# Patient Record
Sex: Female | Born: 1965 | Race: Black or African American | Hispanic: No | Marital: Married | Smoking: Never smoker
Health system: Southern US, Community
[De-identification: ages and names within clinical notes are randomized; demographics above are authoritative.]

## PROBLEM LIST (undated history)

## (undated) DIAGNOSIS — N2889 Other specified disorders of kidney and ureter: Secondary | ICD-10-CM

## (undated) DIAGNOSIS — I10 Essential (primary) hypertension: Secondary | ICD-10-CM

## (undated) DIAGNOSIS — J302 Other seasonal allergic rhinitis: Secondary | ICD-10-CM

## (undated) HISTORY — PX: GASTRIC BYPASS: SHX52

## (undated) HISTORY — PX: ABDOMINAL HYSTERECTOMY: SHX81

## (undated) HISTORY — PX: FOOT ARTHRODESIS, MODIFIED MCBRIDE: SUR52

## (undated) HISTORY — PX: CORNEAL TRANSPLANT: SHX108

## (undated) HISTORY — PX: TONSILLECTOMY: SUR1361

---

## 2004-05-25 ENCOUNTER — Emergency Department: Payer: Self-pay | Admitting: Emergency Medicine

## 2004-05-30 ENCOUNTER — Emergency Department: Payer: Self-pay | Admitting: Emergency Medicine

## 2004-05-30 ENCOUNTER — Ambulatory Visit: Payer: Self-pay | Admitting: Emergency Medicine

## 2007-08-18 ENCOUNTER — Ambulatory Visit: Payer: Self-pay | Admitting: Family Medicine

## 2007-09-07 ENCOUNTER — Ambulatory Visit: Payer: Self-pay | Admitting: Family Medicine

## 2008-07-17 ENCOUNTER — Ambulatory Visit: Payer: Self-pay | Admitting: Family Medicine

## 2008-11-03 ENCOUNTER — Ambulatory Visit: Payer: Self-pay | Admitting: Internal Medicine

## 2008-11-09 ENCOUNTER — Ambulatory Visit: Payer: Self-pay | Admitting: Oncology

## 2008-12-04 ENCOUNTER — Ambulatory Visit: Payer: Self-pay | Admitting: Internal Medicine

## 2008-12-04 ENCOUNTER — Ambulatory Visit: Payer: Self-pay | Admitting: Oncology

## 2009-01-03 ENCOUNTER — Ambulatory Visit: Payer: Self-pay | Admitting: Oncology

## 2009-01-03 ENCOUNTER — Ambulatory Visit: Payer: Self-pay | Admitting: Internal Medicine

## 2009-02-03 ENCOUNTER — Ambulatory Visit: Payer: Self-pay | Admitting: Oncology

## 2009-02-03 ENCOUNTER — Ambulatory Visit: Payer: Self-pay | Admitting: Internal Medicine

## 2009-02-21 ENCOUNTER — Ambulatory Visit: Payer: Self-pay | Admitting: Obstetrics & Gynecology

## 2009-03-01 ENCOUNTER — Ambulatory Visit: Payer: Self-pay | Admitting: Obstetrics & Gynecology

## 2009-03-06 ENCOUNTER — Ambulatory Visit: Payer: Self-pay | Admitting: Internal Medicine

## 2009-03-23 ENCOUNTER — Ambulatory Visit: Payer: Self-pay | Admitting: Internal Medicine

## 2009-04-03 ENCOUNTER — Ambulatory Visit: Payer: Self-pay | Admitting: Internal Medicine

## 2009-05-04 ENCOUNTER — Ambulatory Visit: Payer: Self-pay | Admitting: Internal Medicine

## 2009-06-03 ENCOUNTER — Ambulatory Visit: Payer: Self-pay | Admitting: Internal Medicine

## 2009-07-04 ENCOUNTER — Ambulatory Visit: Payer: Self-pay | Admitting: Internal Medicine

## 2012-02-24 DIAGNOSIS — E669 Obesity, unspecified: Secondary | ICD-10-CM | POA: Insufficient documentation

## 2012-05-13 ENCOUNTER — Ambulatory Visit: Payer: Self-pay | Admitting: Family Medicine

## 2012-05-20 DIAGNOSIS — N2889 Other specified disorders of kidney and ureter: Secondary | ICD-10-CM | POA: Insufficient documentation

## 2014-10-25 DIAGNOSIS — R809 Proteinuria, unspecified: Secondary | ICD-10-CM | POA: Insufficient documentation

## 2014-10-28 DIAGNOSIS — E538 Deficiency of other specified B group vitamins: Secondary | ICD-10-CM | POA: Insufficient documentation

## 2014-10-28 DIAGNOSIS — N183 Chronic kidney disease, stage 3 unspecified: Secondary | ICD-10-CM | POA: Insufficient documentation

## 2014-10-28 DIAGNOSIS — E559 Vitamin D deficiency, unspecified: Secondary | ICD-10-CM | POA: Insufficient documentation

## 2015-09-24 ENCOUNTER — Encounter: Payer: Self-pay | Admitting: Podiatry

## 2015-09-24 ENCOUNTER — Ambulatory Visit: Payer: Self-pay

## 2015-10-19 DIAGNOSIS — J301 Allergic rhinitis due to pollen: Secondary | ICD-10-CM | POA: Insufficient documentation

## 2015-12-04 DIAGNOSIS — Z9884 Bariatric surgery status: Secondary | ICD-10-CM | POA: Insufficient documentation

## 2015-12-15 NOTE — Progress Notes (Signed)
This encounter was created in error - please disregard.

## 2016-02-21 ENCOUNTER — Inpatient Hospital Stay: Admission: RE | Admit: 2016-02-21 | Payer: Self-pay | Source: Ambulatory Visit

## 2016-02-26 ENCOUNTER — Encounter
Admission: RE | Admit: 2016-02-26 | Discharge: 2016-02-26 | Disposition: A | Discharge status: Home / Self Care | Payer: Managed Care, Other (non HMO) | Source: Ambulatory Visit | Attending: Orthopedic Surgery | Admitting: Orthopedic Surgery

## 2016-02-26 ENCOUNTER — Encounter: Payer: Self-pay | Admitting: *Deleted

## 2016-02-26 DIAGNOSIS — R9431 Abnormal electrocardiogram [ECG] [EKG]: Secondary | ICD-10-CM | POA: Insufficient documentation

## 2016-02-26 DIAGNOSIS — Z90710 Acquired absence of both cervix and uterus: Secondary | ICD-10-CM | POA: Diagnosis not present

## 2016-02-26 DIAGNOSIS — I129 Hypertensive chronic kidney disease with stage 1 through stage 4 chronic kidney disease, or unspecified chronic kidney disease: Secondary | ICD-10-CM | POA: Diagnosis not present

## 2016-02-26 DIAGNOSIS — G4730 Sleep apnea, unspecified: Secondary | ICD-10-CM | POA: Diagnosis not present

## 2016-02-26 DIAGNOSIS — Z888 Allergy status to other drugs, medicaments and biological substances status: Secondary | ICD-10-CM | POA: Diagnosis not present

## 2016-02-26 DIAGNOSIS — Z833 Family history of diabetes mellitus: Secondary | ICD-10-CM | POA: Diagnosis not present

## 2016-02-26 DIAGNOSIS — Z01810 Encounter for preprocedural cardiovascular examination: Secondary | ICD-10-CM

## 2016-02-26 DIAGNOSIS — D649 Anemia, unspecified: Secondary | ICD-10-CM | POA: Diagnosis not present

## 2016-02-26 DIAGNOSIS — I517 Cardiomegaly: Secondary | ICD-10-CM | POA: Insufficient documentation

## 2016-02-26 DIAGNOSIS — Z91040 Latex allergy status: Secondary | ICD-10-CM | POA: Diagnosis not present

## 2016-02-26 DIAGNOSIS — E538 Deficiency of other specified B group vitamins: Secondary | ICD-10-CM | POA: Diagnosis not present

## 2016-02-26 DIAGNOSIS — Z8249 Family history of ischemic heart disease and other diseases of the circulatory system: Secondary | ICD-10-CM | POA: Diagnosis not present

## 2016-02-26 DIAGNOSIS — Z8489 Family history of other specified conditions: Secondary | ICD-10-CM | POA: Diagnosis not present

## 2016-02-26 DIAGNOSIS — I10 Essential (primary) hypertension: Secondary | ICD-10-CM

## 2016-02-26 DIAGNOSIS — M654 Radial styloid tenosynovitis [de Quervain]: Secondary | ICD-10-CM | POA: Diagnosis present

## 2016-02-26 DIAGNOSIS — Z8042 Family history of malignant neoplasm of prostate: Secondary | ICD-10-CM | POA: Diagnosis not present

## 2016-02-26 DIAGNOSIS — Z79899 Other long term (current) drug therapy: Secondary | ICD-10-CM | POA: Diagnosis not present

## 2016-02-26 DIAGNOSIS — Z6841 Body Mass Index (BMI) 40.0 and over, adult: Secondary | ICD-10-CM | POA: Diagnosis not present

## 2016-02-26 DIAGNOSIS — Z91013 Allergy to seafood: Secondary | ICD-10-CM | POA: Diagnosis not present

## 2016-02-26 DIAGNOSIS — Z7982 Long term (current) use of aspirin: Secondary | ICD-10-CM | POA: Diagnosis not present

## 2016-02-26 DIAGNOSIS — Z803 Family history of malignant neoplasm of breast: Secondary | ICD-10-CM | POA: Diagnosis not present

## 2016-02-26 DIAGNOSIS — G4700 Insomnia, unspecified: Secondary | ICD-10-CM | POA: Diagnosis not present

## 2016-02-26 DIAGNOSIS — E559 Vitamin D deficiency, unspecified: Secondary | ICD-10-CM | POA: Diagnosis not present

## 2016-02-26 DIAGNOSIS — N183 Chronic kidney disease, stage 3 (moderate): Secondary | ICD-10-CM | POA: Diagnosis not present

## 2016-02-26 HISTORY — DX: Other seasonal allergic rhinitis: J30.2

## 2016-02-26 HISTORY — DX: Other specified disorders of kidney and ureter: N28.89

## 2016-02-26 HISTORY — DX: Essential (primary) hypertension: I10

## 2016-02-26 NOTE — Patient Instructions (Addendum)
  Your procedure is scheduled on: 02/28/16 Thurs Report to Same Day Surgery 2nd floor medical mall [facility](Medical Mall Entrance-take elevator on left to 2nd floor.  Check in with surgery information desk.) To find out your arrival time please call [phone] between 1PM - 3PM on 02/27/16 Wed Remember: Instructions that are not followed completely may result in serious medical risk, up to and including death, or upon the discretion of your surgeon and anesthesiologist your surgery may need to be rescheduled.    _x___ 1. Do not eat food or drink liquids after midnight. No gum chewing or hard candies.     __x__ 2. No Alcohol for 24 hours before or after surgery.   __x__3. No Smoking for 24 prior to surgery.   ____  4. Bring all medications with you on the day of surgery if instructed.    __x__ 5. Notify your doctor if there is any change in your medical condition     (cold, fever, infections).     Do not wear jewelry, make-up, hairpins, clips or nail polish.  Do not wear lotions, powders, or perfumes. You may wear deodorant.  Do not shave 48 hours prior to surgery. Men may shave face and neck.  Do not bring valuables to the hospital.    [facility] not [facility] for any belongings or valuables.               Contacts, dentures or bridgework may not be worn into surgery.  Leave your suitcase in the car. After surgery it may be brought to your room.  For patients admitted to the hospital, discharge time is determined by your treatment team.   Patients discharged the day of surgery will not be allowed to drive home.  You will need someone to drive you home and stay with you the night of your procedure.    Please read over the following fact sheets that you were given:   [facility] Pre[facility] and or MRSA Information   _x___ Take these medicines the morning of surgery with A SIP OF WATER:    1. lisinopril (PRINIVIL  2.  3.  4.  5.  6.  ____Fleets enema or Magnesium  Citrate as directed.   _x___ Use CHG Soap or sage wipes as directed on instruction sheet   ____ Use inhalers on the day of surgery and bring to hospital day of surgery  ____ Stop metformin 2 days prior to surgery    ____ Take 1/2 of usual insulin dose the night before surgery and none on the morning of           surgery.   ____ Stop Aspirin, Coumadin, Pllavix ,Eliquis, Effient, or Pradaxa  x__ Stop Anti-inflammatories such as Advil, Aleve, Ibuprofen, Motrin, Naproxen,          Naprosyn, Goodies powders or aspirin products. Ok to take Tylenol.   ____ Stop supplements until after surgery.    ____ Bring C-Pap to the hospital.

## 2016-02-28 ENCOUNTER — Ambulatory Visit
Admission: RE | Admit: 2016-02-28 | Discharge: 2016-02-28 | Disposition: A | Discharge status: Home / Self Care | Payer: Managed Care, Other (non HMO) | Source: Ambulatory Visit | Attending: Orthopedic Surgery | Admitting: Orthopedic Surgery

## 2016-02-28 ENCOUNTER — Ambulatory Visit: Payer: Managed Care, Other (non HMO) | Admitting: Anesthesiology

## 2016-02-28 ENCOUNTER — Encounter: Payer: Self-pay | Admitting: *Deleted

## 2016-02-28 ENCOUNTER — Encounter
Admission: RE | Disposition: A | Discharge status: Home / Self Care | Payer: Self-pay | Source: Ambulatory Visit | Attending: Orthopedic Surgery

## 2016-02-28 DIAGNOSIS — Z888 Allergy status to other drugs, medicaments and biological substances status: Secondary | ICD-10-CM | POA: Insufficient documentation

## 2016-02-28 DIAGNOSIS — Z803 Family history of malignant neoplasm of breast: Secondary | ICD-10-CM | POA: Insufficient documentation

## 2016-02-28 DIAGNOSIS — Z79899 Other long term (current) drug therapy: Secondary | ICD-10-CM | POA: Insufficient documentation

## 2016-02-28 DIAGNOSIS — Z6841 Body Mass Index (BMI) 40.0 and over, adult: Secondary | ICD-10-CM | POA: Insufficient documentation

## 2016-02-28 DIAGNOSIS — M654 Radial styloid tenosynovitis [de Quervain]: Secondary | ICD-10-CM | POA: Insufficient documentation

## 2016-02-28 DIAGNOSIS — Z7982 Long term (current) use of aspirin: Secondary | ICD-10-CM | POA: Insufficient documentation

## 2016-02-28 DIAGNOSIS — Z833 Family history of diabetes mellitus: Secondary | ICD-10-CM | POA: Insufficient documentation

## 2016-02-28 DIAGNOSIS — G4700 Insomnia, unspecified: Secondary | ICD-10-CM | POA: Insufficient documentation

## 2016-02-28 DIAGNOSIS — Z90710 Acquired absence of both cervix and uterus: Secondary | ICD-10-CM | POA: Insufficient documentation

## 2016-02-28 DIAGNOSIS — G4730 Sleep apnea, unspecified: Secondary | ICD-10-CM | POA: Insufficient documentation

## 2016-02-28 DIAGNOSIS — Z91013 Allergy to seafood: Secondary | ICD-10-CM | POA: Insufficient documentation

## 2016-02-28 DIAGNOSIS — E538 Deficiency of other specified B group vitamins: Secondary | ICD-10-CM | POA: Insufficient documentation

## 2016-02-28 DIAGNOSIS — Z8042 Family history of malignant neoplasm of prostate: Secondary | ICD-10-CM | POA: Insufficient documentation

## 2016-02-28 DIAGNOSIS — Z8489 Family history of other specified conditions: Secondary | ICD-10-CM | POA: Insufficient documentation

## 2016-02-28 DIAGNOSIS — Z91040 Latex allergy status: Secondary | ICD-10-CM | POA: Insufficient documentation

## 2016-02-28 DIAGNOSIS — D649 Anemia, unspecified: Secondary | ICD-10-CM | POA: Insufficient documentation

## 2016-02-28 DIAGNOSIS — Z8249 Family history of ischemic heart disease and other diseases of the circulatory system: Secondary | ICD-10-CM | POA: Insufficient documentation

## 2016-02-28 DIAGNOSIS — I129 Hypertensive chronic kidney disease with stage 1 through stage 4 chronic kidney disease, or unspecified chronic kidney disease: Secondary | ICD-10-CM | POA: Insufficient documentation

## 2016-02-28 DIAGNOSIS — E559 Vitamin D deficiency, unspecified: Secondary | ICD-10-CM | POA: Insufficient documentation

## 2016-02-28 DIAGNOSIS — N183 Chronic kidney disease, stage 3 (moderate): Secondary | ICD-10-CM | POA: Insufficient documentation

## 2016-02-28 HISTORY — PX: DORSAL COMPARTMENT RELEASE: SHX5039

## 2016-02-28 SURGERY — RELEASE, FIRST DORSAL COMPARTMENT, HAND
Anesthesia: General | Site: Wrist | Laterality: Left | Wound class: Clean

## 2016-02-28 MED ORDER — NEOMYCIN-POLYMYXIN B GU 40-200000 IR SOLN
Status: DC | PRN
  Administered 2016-02-28: 2 mL

## 2016-02-28 MED ORDER — HYDROCODONE-ACETAMINOPHEN 5-325 MG PO TABS: 1.0000 | ORAL_TABLET | Freq: Four times a day (QID) | ORAL | Status: DC | PRN

## 2016-02-28 MED ORDER — ONDANSETRON HCL 4 MG PO TABS: 4.0000 mg | ORAL_TABLET | Freq: Four times a day (QID) | ORAL | Status: DC | PRN

## 2016-02-28 MED ORDER — HYDROCODONE-ACETAMINOPHEN 5-325 MG PO TABS: 1.0000 | ORAL_TABLET | Freq: Four times a day (QID) | ORAL | 0 refills | Status: AC | PRN

## 2016-02-28 MED ORDER — BUPIVACAINE HCL (PF) 0.5 % IJ SOLN
INTRAMUSCULAR | Status: DC | PRN
  Administered 2016-02-28: 5 mL

## 2016-02-28 MED ORDER — ONDANSETRON HCL 4 MG/2ML IJ SOLN: 4.0000 mg | Freq: Four times a day (QID) | INTRAMUSCULAR | Status: DC | PRN

## 2016-02-28 MED ORDER — FENTANYL CITRATE (PF) 100 MCG/2ML IJ SOLN
25.0000 ug | INTRAMUSCULAR | Status: DC | PRN
  Administered 2016-02-28: 50 ug via INTRAVENOUS
  Administered 2016-02-28 (×2): 25 ug via INTRAVENOUS

## 2016-02-28 MED ORDER — PROPOFOL 10 MG/ML IV BOLUS
INTRAVENOUS | Status: DC | PRN
  Administered 2016-02-28: 180 mg via INTRAVENOUS

## 2016-02-28 MED ORDER — PROMETHAZINE HCL 25 MG/ML IJ SOLN: 6.2500 mg | INTRAMUSCULAR | Status: DC | PRN

## 2016-02-28 MED ORDER — NEOMYCIN-POLYMYXIN B GU 40-200000 IR SOLN
Status: AC
  Filled 2016-02-28: qty 2

## 2016-02-28 MED ORDER — FENTANYL CITRATE (PF) 100 MCG/2ML IJ SOLN
INTRAMUSCULAR | Status: AC
  Filled 2016-02-28: qty 2

## 2016-02-28 MED ORDER — METOCLOPRAMIDE HCL 10 MG PO TABS: 5.0000 mg | ORAL_TABLET | Freq: Three times a day (TID) | ORAL | Status: DC | PRN

## 2016-02-28 MED ORDER — HYDROCODONE-ACETAMINOPHEN 5-325 MG PO TABS
ORAL_TABLET | ORAL | Status: AC
  Filled 2016-02-28: qty 1

## 2016-02-28 MED ORDER — METOCLOPRAMIDE HCL 5 MG/ML IJ SOLN: 5.0000 mg | Freq: Three times a day (TID) | INTRAMUSCULAR | Status: DC | PRN

## 2016-02-28 MED ORDER — MIDAZOLAM HCL 2 MG/2ML IJ SOLN
INTRAMUSCULAR | Status: AC
  Filled 2016-02-28: qty 2

## 2016-02-28 MED ORDER — FAMOTIDINE 20 MG PO TABS
20.0000 mg | ORAL_TABLET | Freq: Once | ORAL | Status: AC
  Administered 2016-02-28: 20 mg via ORAL

## 2016-02-28 MED ORDER — BUPIVACAINE HCL (PF) 0.5 % IJ SOLN
INTRAMUSCULAR | Status: AC
  Filled 2016-02-28: qty 30

## 2016-02-28 MED ORDER — LIDOCAINE HCL (CARDIAC) 20 MG/ML IV SOLN
INTRAVENOUS | Status: DC | PRN
  Administered 2016-02-28: 60 mg via INTRAVENOUS

## 2016-02-28 MED ORDER — FENTANYL CITRATE (PF) 100 MCG/2ML IJ SOLN
INTRAMUSCULAR | Status: AC
  Administered 2016-02-28: 50 ug via INTRAVENOUS
  Filled 2016-02-28: qty 2

## 2016-02-28 MED ORDER — PROPOFOL 10 MG/ML IV BOLUS
INTRAVENOUS | Status: AC
  Filled 2016-02-28: qty 20

## 2016-02-28 MED ORDER — FAMOTIDINE 20 MG PO TABS
ORAL_TABLET | ORAL | Status: AC
  Filled 2016-02-28: qty 1

## 2016-02-28 MED ORDER — FENTANYL CITRATE (PF) 100 MCG/2ML IJ SOLN
INTRAMUSCULAR | Status: DC | PRN
  Administered 2016-02-28 (×2): 25 ug via INTRAVENOUS
  Administered 2016-02-28: 50 ug via INTRAVENOUS

## 2016-02-28 MED ORDER — SODIUM CHLORIDE 0.9 % IV SOLN: INTRAVENOUS | Status: DC

## 2016-02-28 MED ORDER — ONDANSETRON HCL 4 MG/2ML IJ SOLN
INTRAMUSCULAR | Status: AC
  Filled 2016-02-28: qty 2

## 2016-02-28 MED ORDER — ONDANSETRON HCL 4 MG/2ML IJ SOLN
INTRAMUSCULAR | Status: DC | PRN
  Administered 2016-02-28: 4 mg via INTRAVENOUS

## 2016-02-28 MED ORDER — HYDROCODONE-ACETAMINOPHEN 5-325 MG PO TABS
1.0000 | ORAL_TABLET | ORAL | Status: DC | PRN
  Administered 2016-02-28: 2 via ORAL

## 2016-02-28 MED ORDER — SEVOFLURANE IN SOLN
RESPIRATORY_TRACT | Status: AC
  Filled 2016-02-28: qty 250

## 2016-02-28 MED ORDER — MIDAZOLAM HCL 2 MG/2ML IJ SOLN
INTRAMUSCULAR | Status: DC | PRN
  Administered 2016-02-28: 2 mg via INTRAVENOUS

## 2016-02-28 MED ORDER — LIDOCAINE HCL (PF) 2 % IJ SOLN
INTRAMUSCULAR | Status: AC
  Filled 2016-02-28: qty 2

## 2016-02-28 MED ORDER — LACTATED RINGERS IV SOLN
INTRAVENOUS | Status: DC
  Administered 2016-02-28: 10:00:00 via INTRAVENOUS

## 2016-02-28 MED ORDER — LACTATED RINGERS IV SOLN
INTRAVENOUS | Status: DC | PRN
  Administered 2016-02-28: 10:00:00 via INTRAVENOUS

## 2016-02-28 SURGICAL SUPPLY — 29 items
BANDAGE ELASTIC 3 CLIP NS LF (GAUZE/BANDAGES/DRESSINGS) ×2 IMPLANT
BNDG ESMARK 4X12 TAN STRL LF (GAUZE/BANDAGES/DRESSINGS) ×2 IMPLANT
CANISTER SUCT 1200ML W/VALVE (MISCELLANEOUS) ×2 IMPLANT
CAST PADDING 3X4FT ST 30246 (SOFTGOODS) ×1
CHLORAPREP W/TINT 26ML (MISCELLANEOUS) ×2 IMPLANT
CUFF TOURN 18 STER (MISCELLANEOUS) ×2 IMPLANT
ELECT REM PT RETURN 9FT ADLT (ELECTROSURGICAL) ×2
ELECTRODE REM PT RTRN 9FT ADLT (ELECTROSURGICAL) ×1 IMPLANT
GAUZE PETRO XEROFOAM 1X8 (MISCELLANEOUS) ×2 IMPLANT
GAUZE SPONGE 4X4 12PLY STRL (GAUZE/BANDAGES/DRESSINGS) ×2 IMPLANT
GLOVE BIOGEL PI IND STRL 9 (GLOVE) ×1 IMPLANT
GLOVE BIOGEL PI INDICATOR 9 (GLOVE) ×1
GLOVE SURG SYN 9.0  PF PI (GLOVE) ×1
GLOVE SURG SYN 9.0 PF PI (GLOVE) ×1 IMPLANT
GOWN SRG 2XL LVL 4 RGLN SLV (GOWNS) ×1 IMPLANT
GOWN STRL NON-REIN 2XL LVL4 (GOWNS) ×1
GOWN STRL REUS W/ TWL LRG LVL3 (GOWN DISPOSABLE) ×1 IMPLANT
GOWN STRL REUS W/TWL LRG LVL3 (GOWN DISPOSABLE) ×1
KIT RM TURNOVER STRD PROC AR (KITS) ×2 IMPLANT
NS IRRIG 500ML POUR BTL (IV SOLUTION) ×2 IMPLANT
PACK EXTREMITY ARMC (MISCELLANEOUS) ×2 IMPLANT
PAD CAST CTTN 3X4 STRL (SOFTGOODS) ×1 IMPLANT
SPLINT CAST 1 STEP 3X12 (MISCELLANEOUS) ×2 IMPLANT
STOCKINETTE STRL 4IN 9604848 (GAUZE/BANDAGES/DRESSINGS) ×2 IMPLANT
SUT ETHILON 4-0 (SUTURE) ×1
SUT ETHILON 4-0 FS2 18XMFL BLK (SUTURE) ×1
SUT VIC AB 3-0 SH 27 (SUTURE) ×1
SUT VIC AB 3-0 SH 27X BRD (SUTURE) ×1 IMPLANT
SUTURE ETHLN 4-0 FS2 18XMF BLK (SUTURE) ×1 IMPLANT

## 2016-02-28 NOTE — Op Note (Signed)
02/28/2016  10:49 AM  PATIENT:  Jane Kim  51 y.o. female  PRE-OPERATIVE DIAGNOSIS:  DEQUERVAINS DISEASE  POST-OPERATIVE DIAGNOSIS:  DEQUERVAINS DISEASE  PROCEDURE:  Procedure(s): RELEASE DORSAL COMPARTMENT (DEQUERVAIN) (Left)  SURGEON: Leitha SchullerMichael J Jesten Cappuccio, MD  ASSISTANTS: None  ANESTHESIA:   general  EBL:  Total I/O In: 400 [I.V.:400] Out: -   BLOOD ADMINISTERED:none  DRAINS: none   LOCAL MEDICATIONS USED:  MARCAINE     SPECIMEN:  No Specimen  DISPOSITION OF SPECIMEN:  N/A  COUNTS:  YES  TOURNIQUET:   19 minutes at 250 mmHg  IMPLANTS: None  DICTATION: .Dragon Dictation patient brought the operating room and after general anesthesia was obtained left arm was prepped and draped in sterile fashion with a tourniquet applied the upper forearm. After patient identification and timeout procedure after having prepped and draped the arm the tourniquet was raised. Scissors was made over the distal radius over the hard mass which was contributing to the de Quervain's. Subcutaneous tissues tissue spread Preserving subcutaneous nerves and veins. The area of compression was identified and incision made with release of the tendons there is a second half which was also released and after this there is clear that the entire release had been obtained the tissue was quite thick distally after release again the tendon appeared intact without much damage just a superficial fibers with moderate tenosynovitis present. After adequate release and placing the thumb through range of motion showing complete release the wound was infiltrated with 5 cc half percent Sensorcaine proximal incision to allow for postop analgesia. The wound was then closed with 4-0 Monocryl subcutaneously and Dermabond for the skin. When this was set Telfa 4 x 4's web roll and Ace wrap were applied and tourniquet was let down  PLAN OF CARE: Discharge to home after PACU  PATIENT DISPOSITION:  PACU - hemodynamically stable.

## 2016-02-28 NOTE — Transfer of Care (Signed)
Immediate Anesthesia Transfer of Care Note  Patient: Jane PaulsJackie H Rhee  Procedure(s) Performed: Procedure(s): RELEASE DORSAL COMPARTMENT (DEQUERVAIN) (Left)  Patient Location: PACU  Anesthesia Type:General  Level of Consciousness: awake  Airway & Oxygen Therapy: Patient Spontanous Breathing and Patient connected to face mask oxygen  Post-op Assessment: Report given to RN and Post -op Vital signs reviewed and stable  Post vital signs: Reviewed and stable  Last Vitals:  Vitals:   02/28/16 0909  BP: (!) 150/81  Pulse: 81  Resp: 20  Temp: 36.7 C    Last Pain:  Vitals:   02/28/16 0909  TempSrc: Oral         Complications: No apparent anesthesia complications

## 2016-02-28 NOTE — H&P (Signed)
Reviewed paper H+P, will be scanned into chart. No changes noted.  

## 2016-02-28 NOTE — Anesthesia Preprocedure Evaluation (Addendum)
Anesthesia Evaluation  Patient identified by MRN, date of birth, ID band Patient awake    Reviewed: Allergy & Precautions, H&P , NPO status , Patient's Chart, lab work & pertinent test results, reviewed documented beta blocker date and time   History of Anesthesia Complications Negative for: history of anesthetic complications  Airway Mallampati: I  TM Distance: >3 FB Neck ROM: full    Dental no notable dental hx. (+) Teeth Intact   Pulmonary neg shortness of breath, sleep apnea , neg COPD, neg recent URI,           Cardiovascular Exercise Tolerance: Good hypertension, (-) angina(-) CAD, (-) Past MI, (-) Cardiac Stents and (-) CABG (-) dysrhythmias (-) Valvular Problems/Murmurs     Neuro/Psych negative neurological ROS  negative psych ROS   GI/Hepatic negative GI ROS, Neg liver ROS,   Endo/Other  neg diabetesMorbid obesity  Renal/GU negative Renal ROS  negative genitourinary   Musculoskeletal   Abdominal   Peds  Hematology negative hematology ROS (+)   Anesthesia Other Findings Past Medical History: No date: Hypertension No date: Left kidney mass No date: Seasonal allergies   Reproductive/Obstetrics negative OB ROS                             Anesthesia Physical Anesthesia Plan  ASA: III  Anesthesia Plan: General   Post-op Pain Management:    Induction:   Airway Management Planned:   Additional Equipment:   Intra-op Plan:   Post-operative Plan:   Informed Consent: I have reviewed the patients History and Physical, chart, labs and discussed the procedure including the risks, benefits and alternatives for the proposed anesthesia with the patient or authorized representative who has indicated his/her understanding and acceptance.   Dental Advisory Given  Plan Discussed with: Anesthesiologist, CRNA and Surgeon  Anesthesia Plan Comments:        Anesthesia Quick  Evaluation

## 2016-02-28 NOTE — Anesthesia Post-op Follow-up Note (Cosign Needed)
Anesthesia QCDR form completed.        

## 2016-02-28 NOTE — OR Nursing (Signed)
Mild nausea no vomiting.  Healing touch treatment of [place] done.  Deep relaxation noted and nausea improved.

## 2016-02-28 NOTE — Discharge Instructions (Addendum)
Keep dressing clean and dry. Loosen Ace wrap if fingers swell. Pain medicine as directed AMBULATORY SURGERY  DISCHARGE INSTRUCTIONS   1) The drugs that you were given will stay in your system until tomorrow so for the next 24 hours you should not:  A) Drive an automobile B) Make any legal decisions C) Drink any alcoholic beverage   2) You may resume regular meals tomorrow.  Today it is better to start with liquids and gradually work up to solid foods.  You may eat anything you prefer, but it is better to start with liquids, then soup and crackers, and gradually work up to solid foods.   3) Please notify your doctor immediately if you have any unusual bleeding, trouble breathing, redness and pain at the surgery site, drainage, fever, or pain not relieved by medication.    4) Additional Instructions: Ice as needed for comfort.  Wiggle fingers often   Please contact your physician with any problems or Same Day Surgery at [phone], Monday through Friday 6 am to 4 pm, or [place] at [facility] Main number at [phone].

## 2016-02-28 NOTE — Anesthesia Procedure Notes (Signed)
Procedure Name: LMA Insertion Date/Time: 02/28/2016 10:05 AM Performed by: Henrietta HooverPOPE, Yolandra Habig Pre-anesthesia Checklist: Patient identified, Emergency Drugs available, Suction available, Patient being monitored and Timeout performed Patient Re-evaluated:Patient Re-evaluated prior to inductionOxygen Delivery Method: Circle system utilized Preoxygenation: Pre-oxygenation with 100% oxygen Intubation Type: IV induction Ventilation: Mask ventilation without difficulty LMA: LMA inserted LMA Size: 4.0 Number of attempts: 1 Placement Confirmation: positive ETCO2 Tube secured with: Tape Dental Injury: Teeth and Oropharynx as per pre-operative assessment

## 2016-02-29 NOTE — Anesthesia Postprocedure Evaluation (Signed)
Anesthesia Post Note  Patient: Jane PaulsJackie H Bergerson  Procedure(s) Performed: Procedure(s) (LRB): RELEASE DORSAL COMPARTMENT (DEQUERVAIN) (Left)  Patient location during evaluation: PACU Anesthesia Type: General Level of consciousness: awake and alert Pain management: pain level controlled Vital Signs Assessment: post-procedure vital signs reviewed and stable Respiratory status: spontaneous breathing, nonlabored ventilation, respiratory function stable and patient connected to nasal cannula oxygen Cardiovascular status: blood pressure returned to baseline and stable Postop Assessment: no signs of nausea or vomiting Anesthetic complications: no     Last Vitals:  Vitals:   02/28/16 1146 02/28/16 1311  BP: (!) 182/82 (!) 163/90  Pulse: 61 (!) 52  Resp: 16 16  Temp: 36.7 C     Last Pain:  Vitals:   02/29/16 0832  TempSrc:   PainSc: 6                  Lenard SimmerAndrew Jauna Raczynski

## 2016-04-17 DIAGNOSIS — G4733 Obstructive sleep apnea (adult) (pediatric): Secondary | ICD-10-CM | POA: Insufficient documentation

## 2016-06-09 ENCOUNTER — Other Ambulatory Visit: Payer: Self-pay | Admitting: Family Medicine

## 2016-06-09 DIAGNOSIS — Z1231 Encounter for screening mammogram for malignant neoplasm of breast: Secondary | ICD-10-CM

## 2016-06-24 ENCOUNTER — Ambulatory Visit
Admission: RE | Admit: 2016-06-24 | Discharge: 2016-06-24 | Disposition: A | Discharge status: Home / Self Care | Payer: Managed Care, Other (non HMO) | Source: Ambulatory Visit | Attending: Family Medicine | Admitting: Family Medicine

## 2016-06-24 DIAGNOSIS — Z1231 Encounter for screening mammogram for malignant neoplasm of breast: Secondary | ICD-10-CM | POA: Diagnosis not present

## 2016-07-29 ENCOUNTER — Other Ambulatory Visit: Payer: Self-pay | Admitting: Pediatrics

## 2016-07-29 DIAGNOSIS — R222 Localized swelling, mass and lump, trunk: Secondary | ICD-10-CM

## 2016-07-31 ENCOUNTER — Ambulatory Visit
Admission: RE | Admit: 2016-07-31 | Discharge: 2016-07-31 | Disposition: A | Discharge status: Home / Self Care | Payer: Managed Care, Other (non HMO) | Source: Ambulatory Visit | Attending: Pediatrics | Admitting: Pediatrics

## 2016-07-31 ENCOUNTER — Other Ambulatory Visit: Payer: Self-pay | Admitting: Pediatrics

## 2016-07-31 ENCOUNTER — Other Ambulatory Visit
Admission: RE | Admit: 2016-07-31 | Discharge: 2016-07-31 | Disposition: A | Discharge status: Home / Self Care | Payer: Managed Care, Other (non HMO) | Source: Ambulatory Visit | Attending: Pediatrics | Admitting: Pediatrics

## 2016-07-31 DIAGNOSIS — R222 Localized swelling, mass and lump, trunk: Secondary | ICD-10-CM | POA: Diagnosis not present

## 2016-07-31 DIAGNOSIS — R911 Solitary pulmonary nodule: Secondary | ICD-10-CM | POA: Insufficient documentation

## 2016-07-31 DIAGNOSIS — R1012 Left upper quadrant pain: Secondary | ICD-10-CM | POA: Insufficient documentation

## 2016-07-31 LAB — CREATININE, SERUM
Creatinine, Ser: 1.11 mg/dL — ABNORMAL HIGH (ref 0.44–1.00)
GFR calc non Af Amer: 56 mL/min — ABNORMAL LOW (ref >60)

## 2016-07-31 MED ORDER — IOPAMIDOL (ISOVUE-300) INJECTION 61%
100.0000 mL | Freq: Once | INTRAVENOUS | Status: AC | PRN
  Administered 2016-07-31: 100 mL via INTRAVENOUS

## 2016-08-04 ENCOUNTER — Ambulatory Visit: Payer: Managed Care, Other (non HMO)

## 2016-11-07 ENCOUNTER — Ambulatory Visit (INDEPENDENT_AMBULATORY_CARE_PROVIDER_SITE_OTHER): Payer: 59 | Admitting: Obstetrics & Gynecology

## 2016-11-07 ENCOUNTER — Encounter: Payer: Self-pay | Admitting: Obstetrics & Gynecology

## 2016-11-07 VITALS — BP 140/98 | HR 83 | Ht 65.0 in | Wt 291.0 lb

## 2016-11-07 DIAGNOSIS — A630 Anogenital (venereal) warts: Secondary | ICD-10-CM | POA: Insufficient documentation

## 2016-11-07 MED ORDER — IMIQUIMOD 5 % EX CREA: TOPICAL_CREAM | CUTANEOUS | 3 refills | Status: DC

## 2016-11-07 NOTE — Progress Notes (Signed)
HPI:      [name] is a 51 y.o., presents today for a problem visit.  She complains of: Vulvar concern:   This is a 51 y.o. old Caucasian/White female who presents for the evaluation of vulvar lesion(s). She describes the vulvar lesion(s) as elevated. She indicates that she has noticed 3 lesions that the average size is approximately 0mm to 2mm.  She indicates she first noticed the problem one week ago. She admits to symptoms of no itch or pain.  The following aggravating factors are identified: none. The following alleviating factors are identified: none.  Shehas had no previous colposcopy for this condition. The lesion has had no been biopsied. She has had no previous treatment for this condition.   PMHx: She  has a past medical history of Hypertension; Left kidney mass; and Seasonal allergies. Also,  has a past surgical history that includes Abdominal hysterectomy; Corneal transplant; Tonsillectomy; Gastric bypass; Foot arthrodesis, modified Mcbride; and Dorsal compartment release (Left, 02/28/2016)., family history includes Breast cancer in her maternal grandmother.,  reports that she has never smoked. She has never used smokeless tobacco. She reports that she does not drink alcohol or use drugs.  She has a current medication list which includes the following prescription(s): acetaminophen, albuterol, hydrocodone-acetaminophen, imiquimod, lisinopril, melatonin, multivitamin with minerals, and naproxen sod-diphenhydramine. Also, is allergic to shellfish-derived products; zolpidem; and latex.  Review of Systems  Constitutional: Negative for chills, fever and malaise/fatigue.  HENT: Negative for congestion, sinus pain and sore throat.   Eyes: Negative for blurred vision and pain.  Respiratory: Negative for cough and wheezing.   Cardiovascular: Negative for chest pain and leg swelling.  Gastrointestinal: Negative for abdominal pain, constipation, diarrhea, heartburn, nausea and  vomiting.  Genitourinary: Negative for dysuria, frequency, hematuria and urgency.  Musculoskeletal: Negative for back pain, joint pain, myalgias and neck pain.  Skin: Negative for itching and rash.  Neurological: Negative for dizziness, tremors and weakness.  Endo/Heme/Allergies: Does not bruise/bleed easily.  Psychiatric/Behavioral: Negative for depression. The patient is not nervous/anxious and does not have insomnia.    Objective: BP (!) 140/98   Pulse 83   Ht  (1.651 m)   Wt 291 lb (132 kg)   BMI 48.42 kg/m  Physical Exam  Constitutional: She is oriented to person, place, and time. She appears well-developed and well-nourished. No distress.  Genitourinary: Rectum normal and vagina normal. Pelvic exam was performed with patient supine. There is no rash or lesion on the right labia. There is no rash or lesion on the left labia. Vagina exhibits no lesion. No bleeding in the vagina. Right adnexum does not display mass and does not display tenderness. Left adnexum does not display mass and does not display tenderness.  Genitourinary Comments: Absent Uterus Absent cervix Vaginal cuff well healed  EXT GENITALIA- 3 wart like raised scaly lesions at perineum Peri anal- ext hemorrhoid (small) and no warts  HENT:  Head: Normocephalic and atraumatic. Head is without laceration.  Right Ear: Hearing normal.  Left Ear: Hearing normal.  Nose: No epistaxis.  No foreign bodies.  Mouth/Throat: Uvula is midline, oropharynx is clear and moist and mucous membranes are normal.  Eyes: Pupils are equal, round, and reactive to light.  Neck: Normal range of motion. Neck supple. No thyromegaly present.  Cardiovascular: Normal rate and regular rhythm.  Exam reveals no gallop and no friction rub.   No murmur heard. Pulmonary/Chest: Effort normal and breath sounds normal. No respiratory distress. She has no  wheezes. Right breast exhibits no mass, no skin change and no tenderness. Left breast exhibits no  mass, no skin change and no tenderness.  Abdominal: Soft. Bowel sounds are normal. She exhibits no distension. There is no tenderness. There is no rebound.  Musculoskeletal: Normal range of motion.  Neurological: She is alert and oriented to person, place, and time. No cranial nerve deficit.  Skin: Skin is warm and dry.  Psychiatric: She has a normal mood and affect. Judgment normal.  Vitals reviewed.   ASSESSMENT/PLAN:  New Problem for patient, needs treatement Problem List Items Addressed This Visit      Musculoskeletal and Integument   Genital warts - Primary   Relevant Medications   imiquimod (ALDARA) 5 % cream    New problem to help her with- Rx today and instructions for use.  Side effects discussed.  Follow up arranged.  Alternative tx discussed with pt.  Long term ramifications.  Annamarie Major, MD, [facility], [facility] 11/07/2016  9:26 AM

## 2016-11-07 NOTE — Patient Instructions (Signed)
Genital Warts Genital warts are small growths in the genital area or anal area. They are caused by a type of germ (HPV virus). This germ is spread from person to person during sex. It can be spread through vaginal, anal, and oral sex. Genital warts can lead to other problems if they are not treated. Follow these instructions at home: Medicines  Apply over-the-counter and prescription medicines only as told by your doctor.  Do not use medicines that are meant for treating hand warts.  Talk with your doctor about using anti-itch creams. General instructions  Do not touch or scratch the warts.  Do not have sex until your treatment is done.  Tell your current and past sexual partners about your condition. They may need treatment.  Keep all follow-up visits as told by your doctor. This is important.  After treatment, use condoms during sex. Other Instructions for Women  Women who have genital warts may need to be checked more often for cervical cancer.  If you become pregnant, tell your doctor that you have had genital warts. The germ can be passed to the baby. Contact a doctor if:  You have redness, swelling, or pain in the area of the treated skin.  You have a fever.  You feel generally sick.  You feel lumps in and around your genital area or anal area.  You have bleeding in your genital area or anal area.  You have pain during sex. This information is not intended to replace advice given to you by your health care provider. Make sure you discuss any questions you have with your health care provider. Document Released: 04/16/2009 Document Revised: 06/28/2015 Document Reviewed: 04/17/2014 Elsevier Interactive Patient Education  2018 [employer].  Imiquimod skin cream What is this medicine? IMIQUIMOD (i mi KWI mod) cream is used to treat external genital or anal warts. It is also used to treat other skin conditions such as actinic keratosis and certain types of skin  cancer. This medicine may be used for other purposes; ask your health care provider or pharmacist if you have questions. COMMON BRAND NAME(S): Celesta Aver What should I tell my health care provider before I take this medicine? They need to know if you have any of these conditions: -decreased immune function -an unusual or allergic reaction to imiquimod, other medicines, foods, dyes, or preservatives -pregnant or trying to get pregnant -breast-feeding How should I use this medicine? This medicine is for external use only. Do not take by mouth. Follow the directions on the prescription label. Apply just before bedtime. Wash your hands before and after use. Apply a thin layer of cream and massage gently into the affected areas until no longer visible. Do not use in the mouth, eyes or the vagina. Use this medicine only on the affected area as directed by your health care provider. Do not use for longer than prescribed. It is important not to use more medicine than prescribed. To do so may increase the chance of side effects. Talk to your pediatrician regarding the use of this medicine in children. While this drug may be prescribed for children as young as 78 years of age for selected conditions, precautions do apply. Overdosage: If you think you have taken too much of this medicine contact a poison control center or emergency room at once. NOTE: This medicine is only for you. Do not share this medicine with others. What if I miss a dose? If you miss a dose, use it as soon as  you can. If it is almost time for your next dose, use only that dose. Do not use double or extra doses. What may interact with this medicine? Interactions are not expected. Do not use any other medicines on the treated area without asking your doctor or health care professional. This list may not describe all possible interactions. Give your health care provider a list of all the medicines, herbs, non-prescription drugs, or  dietary supplements you use. Also tell them if you smoke, drink alcohol, or use illegal drugs. Some items may interact with your medicine. What should I watch for while using this medicine? Visit your health care professional for regular checks on your progress. Do not use this medicine until the skin has healed from any other drug (example: podofilox or podophyllin resin) or surgical skin treatment. Females should receive regular pelvic exams while being treated for genital warts. Most patients see improvement within 4 weeks. It may take up to 16 weeks to see a full clearing of the warts. This medicine is not a cure. New warts may develop during or after treatment. Avoid sexual (genital, anal, oral) contact while the cream is on the skin. If warts are visible in the genital area, sexual contact should be avoided until the warts are treated. The use of latex condoms during sexual contact may reduce, but not entirely prevent, infecting others. This medicine may weaken condoms, diaphragms, cervical caps or other barrier devices and make them less effective as birth control. Do not cover the treated area with an airtight bandage. Cotton gauze dressings can be used. Cotton underwear can be worn after using this medicine on the genital or anal area. Actinic keratoses that were not seen before may appear during treatment and may later go away. The treatment area and surrounding area may lighten or darken after treatment with this medicine. These skin color changes may be permanent in some patients. If you experience a skin reaction at the treatment site that interferes or prevents you from doing any daily activity, contact your health care provider. You may need a rest period from treatment. Treatment may be restarted once the reaction has gotten better as recommended by your doctor or health care professional. This medicine can make you more sensitive to the sun. Keep out of the sun. If you cannot avoid being in  the sun, wear protective clothing and use sunscreen. Do not use sun lamps or tanning beds/booths. What side effects may I notice from receiving this medicine? Side effects that you should report to your doctor or health care professional as soon as possible: -open sores with or without drainage -skin infection -skin rash -unusual or severe skin reaction Side effects that usually do not require medical attention (report to your doctor or health care professional if they continue or are bothersome): -burning or itching -redness of the skin (very common but is usually not painful or harmful) -scabbing, crusting, or peeling skin -skin that becomes hard or thickened -swelling of the skin This list may not describe all possible side effects. Call your doctor for medical advice about side effects. You may report side effects to FDA at 1-800-FDA-1088. Where should I keep my medicine? Keep out of the reach of children. Store between 4 and 25 degrees C (39 and 77 degrees F). Do not freeze. Throw away any unused medicine after the expiration date. Discard packet after applying to affected area. Partial packets should not be saved or reused. NOTE: This sheet is a summary. It may  not cover all possible information. If you have questions about this medicine, talk to your doctor, pharmacist, or health care provider.  2018 Elsevier/Gold Standard (2008-01-04 10:33:25)

## 2016-12-29 ENCOUNTER — Encounter: Payer: Self-pay | Admitting: Podiatry

## 2016-12-29 ENCOUNTER — Ambulatory Visit: Payer: 59 | Admitting: Podiatry

## 2016-12-29 ENCOUNTER — Ambulatory Visit (INDEPENDENT_AMBULATORY_CARE_PROVIDER_SITE_OTHER): Payer: 59

## 2016-12-29 DIAGNOSIS — M2141 Flat foot [pes planus] (acquired), right foot: Secondary | ICD-10-CM | POA: Diagnosis not present

## 2016-12-29 DIAGNOSIS — M779 Enthesopathy, unspecified: Principal | ICD-10-CM

## 2016-12-29 DIAGNOSIS — I10 Essential (primary) hypertension: Secondary | ICD-10-CM | POA: Insufficient documentation

## 2016-12-29 DIAGNOSIS — M7750 Other enthesopathy of unspecified foot: Secondary | ICD-10-CM

## 2016-12-29 DIAGNOSIS — N1831 Chronic kidney disease, stage 3a: Secondary | ICD-10-CM | POA: Insufficient documentation

## 2016-12-29 DIAGNOSIS — D649 Anemia, unspecified: Secondary | ICD-10-CM | POA: Insufficient documentation

## 2016-12-29 DIAGNOSIS — G4700 Insomnia, unspecified: Secondary | ICD-10-CM | POA: Insufficient documentation

## 2016-12-29 DIAGNOSIS — M722 Plantar fascial fibromatosis: Secondary | ICD-10-CM

## 2016-12-29 DIAGNOSIS — M778 Other enthesopathies, not elsewhere classified: Secondary | ICD-10-CM

## 2016-12-29 DIAGNOSIS — M2142 Flat foot [pes planus] (acquired), left foot: Secondary | ICD-10-CM | POA: Diagnosis not present

## 2016-12-29 MED ORDER — METHYLPREDNISOLONE 4 MG PO TBPK: ORAL_TABLET | ORAL | 0 refills | Status: DC

## 2016-12-29 NOTE — Patient Instructions (Signed)

## 2016-12-29 NOTE — Progress Notes (Signed)
   Subjective:    Patient ID: Jane Kim, female    DOB: Jul 17, 1965, 51 y.o.   MRN: [id number]  HPI: She presents today with a one-year history of bilateral foot pain. States the left is worse.    Review of Systems  Constitutional: Negative.   HENT: Negative.   Eyes: Negative.   Respiratory: Negative.   Cardiovascular: Negative.   Gastrointestinal: Positive for constipation.  Endocrine: Positive for cold intolerance.  Genitourinary: Negative.   Musculoskeletal:       Difficult walking   Skin: Negative.   Allergic/Immunologic: Positive for food allergies.  Neurological: Positive for numbness.  Hematological: Bruises/bleeds easily.  Psychiatric/Behavioral: Negative.        Objective:   Physical Exam: Vital signs are stable she is alert and oriented 3. Pulses are palpable. Neurologic sensorium is intact. Each reflexes are intact. Muscle strength is 5 over 5 dorsi flex her flexors and inverters everters MUSCULATURES INTACT. ORTHOPEDIC EVALUATIONPES PLANUS WITH ITS PAIN ON PALPATION MEDIAL CALCANEAL TUBERCLE BILATERAL.  RADIOGRAPHIC EVALUATION DEMONSTRATES SOFT TISSUE INCREASE IN DENSITY FOR FRESH CANAL INSERTION SITES BILATERAL.      Assessment & Plan:  Plantar fasciitis bilateral.  Plan: Start him on a Medrol Dosepak to be followed by meloxicam. All information was given for an injection to the bilateral heels after alcohol prep. She'll receive 20 mg of Kenalog to the point of maximal tenderness from medial aspect of the foot to the medial band of the plantar fascia. I also placed her plantar fascia braces and a night splint. Discussed appropriate shoe gear stretching a size ice therapy and shoe modifications. She also received with oral home-going instructions for stretching exercises. Follow-up with me in 1 month.

## 2017-02-04 ENCOUNTER — Ambulatory Visit: Payer: 59 | Admitting: Podiatry

## 2017-02-04 ENCOUNTER — Encounter: Payer: Self-pay | Admitting: Podiatry

## 2017-02-04 DIAGNOSIS — M7750 Other enthesopathy of unspecified foot: Secondary | ICD-10-CM

## 2017-02-04 DIAGNOSIS — L603 Nail dystrophy: Secondary | ICD-10-CM | POA: Diagnosis not present

## 2017-02-04 DIAGNOSIS — M778 Other enthesopathies, not elsewhere classified: Secondary | ICD-10-CM

## 2017-02-04 DIAGNOSIS — M779 Enthesopathy, unspecified: Secondary | ICD-10-CM

## 2017-02-04 DIAGNOSIS — M722 Plantar fascial fibromatosis: Secondary | ICD-10-CM | POA: Diagnosis not present

## 2017-02-04 NOTE — Progress Notes (Signed)
She presents today for follow-up of her plantar fasciitis bilateral heels.  She states that is feeling much better now.  She still complaining of pain around the fifth metatarsal phalangeal joints and her tailor's bunion deformities.  Is also complaining of thick yellow dystrophic malformed toenails which are painful.  Objective: Vital signs are stable she is alert and oriented x3 no calf pain ulcers remain palpable.  He has pain on palpation to the fifth metatarsophalangeal joints demonstrating tenderness on range of motion as well as overlying boggy bursitis regions.  As well as overlying reactive hyperkeratotic lesions.  Her toenails are thick yellow dystrophic onychomycotic there is subungual debris and they are brittle.  Assessment: Pain in limb secondary to onychomycosis tailor's bunion deformities with capsulitis and resolving plantar fasciitis.  Plan: We discussed etiology pathology conservative versus surgical therapies.  At this point today I injected around the fifth metatarsophalangeal joints with dexamethasone and local anesthetic after sterile Betadine skin prep and verbal permission.  She tolerated this procedure well a total of 2-1/2 mg of Marcaine and 2-1/2 mg of dexamethasone was injected to each foot.  We did discuss the need for surgical intervention we will follow-up with her in 1 month after the samples have returned with results of the toenails and discussed not only surgery but are nail treatment plan.

## 2017-03-09 ENCOUNTER — Encounter: Payer: Self-pay | Admitting: Podiatry

## 2017-03-09 ENCOUNTER — Ambulatory Visit: Payer: 59 | Admitting: Podiatry

## 2017-03-09 DIAGNOSIS — Z79899 Other long term (current) drug therapy: Secondary | ICD-10-CM | POA: Diagnosis not present

## 2017-03-09 DIAGNOSIS — L603 Nail dystrophy: Secondary | ICD-10-CM | POA: Diagnosis not present

## 2017-03-09 MED ORDER — FLUCONAZOLE 150 MG PO TABS: ORAL_TABLET | ORAL | 0 refills | Status: DC

## 2017-03-09 NOTE — Progress Notes (Signed)
She presents today for follow-up of her plantar fasciitis bilaterally.  He states that they are doing much better with the braces.  She states that she is approximately 75% improved.  She also presents for follow-up of her onychomycosis.  Objective: Vital signs are stable she is alert and oriented x3.  Pulses are palpable.  Neurologic sensorium is intact.  She has mild tenderness on palpation of the medial calcaneal tubercle bilateral.  Cutaneous evaluation demonstrates no change from previous evaluation consistent with tinea pedis and onychomycosis.  Neurology report does demonstrate a yeast infection as well as a fungal infection.  Assessment: Resolving plantar fasciitis bilateral.  Onychomycosis associated with yeast and fungus.  Plan: At this point because rare elements of fungus were seen I feel that more than likely this is going to be a yeast component so we will start her on Diflucan 2 pills once a week for 12 weeks.  She will be having blood work done in 1 week when she follows up with her primary provider should that come back abnormal we will notify her.  Otherwise I will see her in 2 months

## 2017-05-04 ENCOUNTER — Ambulatory Visit: Payer: 59 | Admitting: Podiatry

## 2017-05-13 ENCOUNTER — Ambulatory Visit: Payer: 59 | Admitting: Podiatry

## 2017-05-19 ENCOUNTER — Other Ambulatory Visit: Payer: Self-pay

## 2017-05-19 ENCOUNTER — Inpatient Hospital Stay: Payer: 59

## 2017-05-19 ENCOUNTER — Encounter: Payer: Self-pay | Admitting: Oncology

## 2017-05-19 ENCOUNTER — Inpatient Hospital Stay: Payer: 59 | Attending: Oncology | Admitting: Oncology

## 2017-05-19 VITALS — BP 157/94 | HR 80 | Temp 97.2°F | Resp 18 | Ht 65.35 in | Wt 299.8 lb

## 2017-05-19 DIAGNOSIS — I10 Essential (primary) hypertension: Secondary | ICD-10-CM | POA: Diagnosis not present

## 2017-05-19 DIAGNOSIS — Z9884 Bariatric surgery status: Secondary | ICD-10-CM

## 2017-05-19 DIAGNOSIS — D892 Hypergammaglobulinemia, unspecified: Secondary | ICD-10-CM | POA: Insufficient documentation

## 2017-05-19 DIAGNOSIS — D519 Vitamin B12 deficiency anemia, unspecified: Secondary | ICD-10-CM | POA: Diagnosis not present

## 2017-05-19 DIAGNOSIS — D649 Anemia, unspecified: Secondary | ICD-10-CM

## 2017-05-19 DIAGNOSIS — R911 Solitary pulmonary nodule: Secondary | ICD-10-CM | POA: Diagnosis not present

## 2017-05-19 DIAGNOSIS — J9859 Other diseases of mediastinum, not elsewhere classified: Secondary | ICD-10-CM | POA: Insufficient documentation

## 2017-05-19 LAB — CBC WITH DIFFERENTIAL/PLATELET
BASOS ABS: 0 10*3/uL (ref 0–0.1)
BASOS PCT: 1 %
Eosinophils Absolute: 0.1 10*3/uL (ref 0–0.7)
Eosinophils Relative: 2 %
HEMATOCRIT: 30.1 % — AB (ref 35.0–47.0)
Hemoglobin: 10.1 g/dL — ABNORMAL LOW (ref 12.0–16.0)
Lymphocytes Relative: 29 %
Lymphs Abs: 1.5 10*3/uL (ref 1.0–3.6)
MCH: 29.4 pg (ref 26.0–34.0)
MCHC: 33.5 g/dL (ref 32.0–36.0)
MCV: 87.8 fL (ref 80.0–100.0)
MONO ABS: 0.4 10*3/uL (ref 0.2–0.9)
MONOS PCT: 8 %
NEUTROS ABS: 3.2 10*3/uL (ref 1.4–6.5)
NEUTROS PCT: 60 %
Platelets: 345 10*3/uL (ref 150–440)
RBC: 3.43 MIL/uL — ABNORMAL LOW (ref 3.80–5.20)
RDW: 13.6 % (ref 11.5–14.5)
WBC: 5.3 10*3/uL (ref 3.6–11.0)

## 2017-05-19 LAB — FOLATE: FOLATE: 30 ng/mL (ref >5.9)

## 2017-05-19 LAB — VITAMIN B12: VITAMIN B 12: 166 pg/mL — AB (ref 180–914)

## 2017-05-19 LAB — LACTATE DEHYDROGENASE: LDH: 159 U/L (ref 98–192)

## 2017-05-19 LAB — IRON AND TIBC
Iron: 60 ug/dL (ref 28–170)
SATURATION RATIOS: 19 % (ref 10.4–31.8)
TIBC: 321 ug/dL (ref 250–450)
UIBC: 261 ug/dL

## 2017-05-19 LAB — FERRITIN: Ferritin: 42 ng/mL (ref 11–307)

## 2017-05-19 NOTE — Progress Notes (Signed)
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   HISTORY OF PRESENTING ILLNESS:  Jane Kim is a  52 y.o.  female with PMH listed below who was referred to me for evaluation of  hypergammaglobulinemia.  Performed extensive [facility] Patient had lab workup done by primary care physician which showed normocytic anemia with hemoglobin of 9.9, MCV 88, normal creatinine 0.9 , bilirubin 0.1, showed asymmetric hypergammaglobulinemia, immunofixation showed no monoclonal component identified.  Patient is referred to oncology for further evaluation. Of note, patient has had CT scan of the abdomen with contrast in June 2018 which showed small left solitary pulmonary nodule, a partially visualized circumscribed 3.0 x 2.2 mediastinal mass at the right posterior Pericardial margin with soft tissue density.,  Patient has not had any workup after that.  She has been referred by primary care physician to [facility] to talk to thoracic surgery for possible biopsy.  She also has had a PET scan set up prior to meeting thoracic surgeon.  #Patient reports feeling tired and fatigued.  No weight loss.  She has good appetite.  Denies fever or chills, chest pain, shortness of breath, abdominal pain, lower extremity swelling. Denies hematochezia, hematuria, hematemesis, epistaxis, black tarry stool or easy bruising.  She has a history of gastric bypass, corneal transplant.  . Review of Systems  Constitutional: Positive for malaise/fatigue. Negative for chills, fever and weight loss.  HENT: Negative for congestion and nosebleeds.   Eyes: Negative for photophobia and pain.  Respiratory: Negative  for cough, hemoptysis and sputum production.   Cardiovascular: Negative for chest pain, palpitations and orthopnea.  Gastrointestinal: Negative for abdominal pain, blood in stool, nausea and vomiting.  Genitourinary: Negative for dysuria and urgency.  Musculoskeletal: Negative for myalgias and neck pain.  Skin: Negative for rash.  Neurological: Negative for tingling, sensory change and speech change.  Endo/Heme/Allergies: Does not bruise/bleed easily.  Psychiatric/Behavioral: Negative for depression and suicidal ideas.    MEDICAL HISTORY:  Past Medical History:  Diagnosis Date  . Hypertension   . Left kidney mass   . Seasonal allergies     SURGICAL HISTORY: Past Surgical History:  Procedure Laterality Date  . ABDOMINAL HYSTERECTOMY    . CORNEAL TRANSPLANT    . DORSAL COMPARTMENT RELEASE Left 02/28/2016   Procedure: RELEASE DORSAL COMPARTMENT (DEQUERVAIN);  Surgeon: Kennedy BuckerMichael Menz, MD;  Location: [facility] ORS;  Service: Orthopedics;  Laterality: Left;  . FOOT ARTHRODESIS, MODIFIED MCBRIDE    . GASTRIC BYPASS    . TONSILLECTOMY      SOCIAL HISTORY: Social History   Socioeconomic History  . Marital status: Married    Spouse name: Not on file  . Number of children: Not on file  . Years of education: Not on file  . Highest education level: Not on file  Occupational History  . Not on file  Social Needs  . Financial resource strain: Not on file  . Food insecurity:    Worry: Not on file    Inability: Not on file  . Transportation needs:    Medical: Not on file    Non-medical: Not on file  Tobacco Use  . Smoking status: Never Smoker  . Smokeless tobacco: Never Used  Substance and Sexual Activity  . Alcohol use: No  .  Drug use: No  . Sexual activity: Not Currently  Lifestyle  . Physical activity:    Days per week: Not on file    Minutes per session: Not on file  . Stress: Not on file  Relationships  . Social connections:    Talks on phone: Not on file    Gets together:  Not on file    Attends religious service: Not on file    Active member of club or organization: Not on file    Attends meetings of clubs or organizations: Not on file    Relationship status: Not on file  . Intimate partner violence:    Fear of current or ex partner: Not on file    Emotionally abused: Not on file    Physically abused: Not on file    Forced sexual activity: Not on file  Other Topics Concern  . Not on file  Social History Narrative  . Not on file    FAMILY HISTORY: Family History  Problem Relation Age of Onset  . Breast cancer Maternal Grandmother   . Lung cancer Mother   . Diabetes Mother   . Congestive Heart Failure Mother   . Hypertension Mother   . Anemia Mother   . Kidney disease Mother   . Hypertension Father   . Coronary artery disease Father   . Anemia Sister   . Hypertension Sister   . Hypertension Brother   . Diabetes Maternal Aunt   . Prostate cancer Maternal Grandfather   . Cirrhosis Paternal Grandfather   . Hypertension Brother     ALLERGIES:  is allergic to shellfish-derived products; zolpidem; and latex.  MEDICATIONS:  Current Outpatient Medications  Medication Sig Dispense Refill  . lisinopril (PRINIVIL,ZESTRIL) 10 MG tablet Take by mouth.    . MELATONIN PO Take 6 mg by mouth at bedtime as needed.    . Multiple Vitamins-Minerals (MULTIVITAMIN ADULT PO) Take by mouth.    Marland Kitchen acetaminophen (TYLENOL) 325 MG tablet Take 650 mg by mouth every 6 (six) hours as needed.    Marland Kitchen albuterol (PROVENTIL HFA;VENTOLIN HFA) 108 (90 Base) MCG/ACT inhaler Inhale 1-2 puffs into the lungs every 6 (six) hours as needed for wheezing or shortness of breath.    . fluticasone (FLONASE) 50 MCG/ACT nasal spray Place into the nose.    Marland Kitchen HYDROcodone-acetaminophen (NORCO) 5-325 MG tablet Take 1 tablet by mouth every 6 (six) hours as needed. (Patient not taking: Reported on 05/19/2017) 15 tablet 0  . Naproxen Sod-Diphenhydramine (ALEVE PM) 220-25 MG TABS Take 2 tablets by  mouth at bedtime as needed (for pain/sleep.).     No current facility-administered medications for this visit.      PHYSICAL EXAMINATION: ECOG PERFORMANCE STATUS: 1 - Symptomatic but completely ambulatory Vitals:   05/19/17 0929  BP: (!) 157/94  Pulse: 80  Resp: 18  Temp: (!) 97.2 F (36.2 C)   Filed Weights   05/19/17 0929  Weight: 299 lb 12.8 oz (136 kg)    Physical Exam  Constitutional: She is oriented to person, place, and time. She appears well-developed and well-nourished. No distress.  Obese  HENT:  Head: Normocephalic and atraumatic.  Eyes: Pupils are equal, round, and reactive to light. EOM are normal.  Neck: Normal range of motion. Neck supple.  Cardiovascular: Normal rate, regular rhythm and normal heart sounds.  Pulmonary/Chest: Effort normal and breath sounds normal.  Abdominal: Soft. Bowel sounds are normal. She exhibits no distension and no mass.  Musculoskeletal: She exhibits no edema.  Lymphadenopathy:    She has no cervical adenopathy.  Neurological: She is alert and oriented to person, place, and time. No cranial nerve deficit.  Skin: Skin is warm and dry. No erythema.  Psychiatric: She has a normal mood and affect. Her behavior is normal.     LABORATORY DATA:  I have reviewed the data as listed Lab Results  Component Value Date   WBC 5.3 05/19/2017   HGB 10.1 (L) 05/19/2017   HCT 30.1 (L) 05/19/2017   MCV 87.8 05/19/2017   PLT 345 05/19/2017   Recent Labs    07/31/16 0944  CREATININE 1.11*  GFRNONAA 56*  GFRAA >60    RADIOGRAPHIC STUDIES: I have personally reviewed the radiological images as listed and agreed with the findings in the report. 07/31/2016 IMPRESSION: 1. No mass, fluid collection or hernia at the area of palpable concern as indicated by the patient in the left abdominal wall. 2. Partially visualized circumscribed 3.0 x 2.2 cm mediastinal mass at the right posterior pericardial margin, with soft tissue density. 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 Recommend MRI chest without and with IV contrast for further characterization (to assess for enhancement). 3. Left lower lobe 2 mm solid pulmonary nodule. No follow-up needed if patient is low-risk. Non-contrast chest CT can be considered in 12 months if patient is high-risk. This recommendation follows the consensus statement: Guidelines for Management of Incidental Pulmonary Nodules Detected on CT Images: From the Fleischner Society 2017; Radiology 2017; 284:228-243.  ASSESSMENT & PLAN:  1. Anemia, unspecified type   2. Mediastinal mass   3. Hypergammaglobulinemia   4. History of gastric bypass   5. Anemia due to vitamin B12 deficiency, unspecified B12 deficiency type    Discussed with patient that anemia can be multifactorial. Anemia: multifactorial with possible causes including chronic blood loss, hyper/hypothyroidism, nutritional deficiency, infection/chronic inflammation, hemolysis, underlying bone marrow disorders. Will check CBC w differential,  vitamin B12, Folate, iron/TIBC, ferritin.   #Regarding to patient is hypergammaglobulinemia which is not monoclonal, discussed with patient that this can be due to reactive process, such as inflammation, infection. I will hold additional work up for now. Can repeat another SPEP in 3 months.   # Mediastinal mass: I personally reviewed patient's CT scan and discussed with patient.  Agree with work up with PET scan and evaluate if any hypermetabolic activity and if any growing of the size. She has an appointment with Thoracic Onc Surgeon to discuss about biopsy/mediastinosocpy if indicated. I am happy to discuss with patient if needed in the future and patient can call cancer center and set up a follow up appointment for this.   Addendum:  # patient showed vitamin B12 deficiency, will call patient and set up parental vitamin B12 injection. B12 deficiency likely secondary to her history of  gastric bypass. Will also check intrinsic factor antibody and anti parietal antibody.   All questions were answered. The patient knows to call the clinic with any problems questions or concerns. Total face to face encounter time for this patient visit was 60 min. >50% of the time was  spent in counseling and coordination of care. Return of visit: 3 months.  Thank you for this kind referral and the opportunity to participate in the care of this patient. A copy of today's note is routed to referring provider    Rickard Patience, MD, PhD Hematology Oncology [facility] Pager- [phone] 05/19/2017

## 2017-05-19 NOTE — Progress Notes (Signed)
Here for new pt evaluation.  

## 2017-05-20 LAB — KAPPA/LAMBDA LIGHT CHAINS
Kappa free light chain: 21.5 mg/L — ABNORMAL HIGH (ref 3.3–19.4)
Kappa, lambda light chain ratio: 1.17 (ref 0.26–1.65)
Lambda free light chains: 18.3 mg/L (ref 5.7–26.3)

## 2017-05-21 LAB — COMP PANEL: LEUKEMIA/LYMPHOMA

## 2017-05-25 ENCOUNTER — Inpatient Hospital Stay: Payer: 59

## 2017-05-25 DIAGNOSIS — D519 Vitamin B12 deficiency anemia, unspecified: Secondary | ICD-10-CM | POA: Diagnosis not present

## 2017-05-25 DIAGNOSIS — E538 Deficiency of other specified B group vitamins: Secondary | ICD-10-CM

## 2017-05-25 MED ORDER — CYANOCOBALAMIN 1000 MCG/ML IJ SOLN
1000.0000 ug | Freq: Once | INTRAMUSCULAR | Status: AC
  Administered 2017-05-25: 1000 ug via INTRAMUSCULAR

## 2017-05-26 ENCOUNTER — Inpatient Hospital Stay: Payer: 59

## 2017-05-26 DIAGNOSIS — D519 Vitamin B12 deficiency anemia, unspecified: Secondary | ICD-10-CM | POA: Diagnosis not present

## 2017-05-26 DIAGNOSIS — E538 Deficiency of other specified B group vitamins: Secondary | ICD-10-CM

## 2017-05-26 MED ORDER — CYANOCOBALAMIN 1000 MCG/ML IJ SOLN
1000.0000 ug | Freq: Once | INTRAMUSCULAR | Status: AC
  Administered 2017-05-26: 1000 ug via INTRAMUSCULAR

## 2017-05-27 ENCOUNTER — Inpatient Hospital Stay: Payer: 59

## 2017-05-27 DIAGNOSIS — E538 Deficiency of other specified B group vitamins: Secondary | ICD-10-CM

## 2017-05-27 DIAGNOSIS — D519 Vitamin B12 deficiency anemia, unspecified: Secondary | ICD-10-CM | POA: Diagnosis not present

## 2017-05-27 MED ORDER — CYANOCOBALAMIN 1000 MCG/ML IJ SOLN
1000.0000 ug | Freq: Once | INTRAMUSCULAR | Status: AC
  Administered 2017-05-27: 1000 ug via INTRAMUSCULAR

## 2017-05-28 ENCOUNTER — Inpatient Hospital Stay: Payer: 59

## 2017-05-28 DIAGNOSIS — E538 Deficiency of other specified B group vitamins: Secondary | ICD-10-CM

## 2017-05-28 DIAGNOSIS — D519 Vitamin B12 deficiency anemia, unspecified: Secondary | ICD-10-CM | POA: Diagnosis not present

## 2017-05-28 MED ORDER — CYANOCOBALAMIN 1000 MCG/ML IJ SOLN
1000.0000 ug | Freq: Once | INTRAMUSCULAR | Status: AC
  Administered 2017-05-28: 1000 ug via INTRAMUSCULAR

## 2017-05-29 ENCOUNTER — Inpatient Hospital Stay: Payer: 59

## 2017-05-29 DIAGNOSIS — D519 Vitamin B12 deficiency anemia, unspecified: Secondary | ICD-10-CM | POA: Diagnosis not present

## 2017-05-29 DIAGNOSIS — E538 Deficiency of other specified B group vitamins: Secondary | ICD-10-CM

## 2017-05-29 MED ORDER — CYANOCOBALAMIN 1000 MCG/ML IJ SOLN
1000.0000 ug | Freq: Once | INTRAMUSCULAR | Status: AC
  Administered 2017-05-29: 1000 ug via INTRAMUSCULAR

## 2017-06-01 ENCOUNTER — Inpatient Hospital Stay: Payer: 59

## 2017-06-01 DIAGNOSIS — E538 Deficiency of other specified B group vitamins: Secondary | ICD-10-CM

## 2017-06-01 DIAGNOSIS — D519 Vitamin B12 deficiency anemia, unspecified: Secondary | ICD-10-CM | POA: Diagnosis not present

## 2017-06-01 MED ORDER — CYANOCOBALAMIN 1000 MCG/ML IJ SOLN
1000.0000 ug | Freq: Once | INTRAMUSCULAR | Status: AC
  Administered 2017-06-01: 1000 ug via INTRAMUSCULAR

## 2017-06-08 ENCOUNTER — Inpatient Hospital Stay: Payer: 59 | Attending: Oncology

## 2017-06-08 DIAGNOSIS — E538 Deficiency of other specified B group vitamins: Secondary | ICD-10-CM

## 2017-06-08 DIAGNOSIS — D519 Vitamin B12 deficiency anemia, unspecified: Secondary | ICD-10-CM | POA: Diagnosis present

## 2017-06-08 MED ORDER — CYANOCOBALAMIN 1000 MCG/ML IJ SOLN
1000.0000 ug | Freq: Once | INTRAMUSCULAR | Status: AC
Start: 2017-06-08 — End: 2017-06-08
  Administered 2017-06-08: 1000 ug via INTRAMUSCULAR

## 2017-06-15 ENCOUNTER — Inpatient Hospital Stay: Payer: 59

## 2017-06-15 DIAGNOSIS — E538 Deficiency of other specified B group vitamins: Secondary | ICD-10-CM

## 2017-06-15 DIAGNOSIS — D519 Vitamin B12 deficiency anemia, unspecified: Secondary | ICD-10-CM | POA: Diagnosis not present

## 2017-06-15 MED ORDER — CYANOCOBALAMIN 1000 MCG/ML IJ SOLN
1000.0000 ug | Freq: Once | INTRAMUSCULAR | Status: AC
  Administered 2017-06-15: 1000 ug via INTRAMUSCULAR

## 2017-06-22 ENCOUNTER — Inpatient Hospital Stay: Payer: 59

## 2017-06-22 DIAGNOSIS — E538 Deficiency of other specified B group vitamins: Secondary | ICD-10-CM

## 2017-06-22 DIAGNOSIS — D519 Vitamin B12 deficiency anemia, unspecified: Secondary | ICD-10-CM | POA: Diagnosis not present

## 2017-06-22 MED ORDER — CYANOCOBALAMIN 1000 MCG/ML IJ SOLN
1000.0000 ug | Freq: Once | INTRAMUSCULAR | Status: AC
Start: 2017-06-22 — End: 2017-06-22
  Administered 2017-06-22: 1000 ug via INTRAMUSCULAR

## 2017-08-11 ENCOUNTER — Inpatient Hospital Stay: Payer: 59 | Attending: Oncology

## 2017-08-18 ENCOUNTER — Inpatient Hospital Stay: Payer: 59 | Admitting: Oncology

## 2017-10-23 ENCOUNTER — Other Ambulatory Visit: Payer: Self-pay | Admitting: Family Medicine

## 2017-10-23 DIAGNOSIS — Z1231 Encounter for screening mammogram for malignant neoplasm of breast: Secondary | ICD-10-CM

## 2017-11-05 ENCOUNTER — Ambulatory Visit
Admission: RE | Admit: 2017-11-05 | Discharge: 2017-11-05 | Disposition: A | Discharge status: Home / Self Care | Payer: 59 | Source: Ambulatory Visit | Attending: Family Medicine | Admitting: Family Medicine

## 2017-11-05 DIAGNOSIS — Z1231 Encounter for screening mammogram for malignant neoplasm of breast: Secondary | ICD-10-CM | POA: Diagnosis present

## 2018-03-30 ENCOUNTER — Ambulatory Visit
Admission: RE | Admit: 2018-03-30 | Discharge: 2018-03-30 | Disposition: A | Discharge status: Home / Self Care | Payer: 59 | Source: Ambulatory Visit | Attending: Medical Oncology | Admitting: Medical Oncology

## 2018-03-30 ENCOUNTER — Other Ambulatory Visit: Payer: Self-pay | Admitting: Medical Oncology

## 2018-03-30 DIAGNOSIS — R05 Cough: Secondary | ICD-10-CM

## 2018-03-30 DIAGNOSIS — R059 Cough, unspecified: Secondary | ICD-10-CM

## 2018-11-16 ENCOUNTER — Other Ambulatory Visit: Payer: Self-pay | Admitting: Family Medicine

## 2018-11-16 DIAGNOSIS — Z1231 Encounter for screening mammogram for malignant neoplasm of breast: Secondary | ICD-10-CM

## 2019-03-18 ENCOUNTER — Ambulatory Visit: Payer: 59 | Attending: Internal Medicine

## 2019-03-18 DIAGNOSIS — Z23 Encounter for immunization: Secondary | ICD-10-CM | POA: Insufficient documentation

## 2019-03-18 NOTE — Progress Notes (Signed)
   Covid-19 Vaccination Clinic  Name:  Jane Kim    MRN: [id number] DOB: November 05, 1965  03/18/2019  [name] was observed post Covid-19 immunization for 15 minutes without incidence. She was provided with Vaccine Information Sheet and instruction to access the V-Safe system.   [name] was instructed to call 911 with any severe reactions post vaccine: Marland Kitchen Difficulty breathing  . Swelling of your face and throat  . A fast heartbeat  . A bad rash all over your body  . Dizziness and weakness    Immunizations Administered    Name Date Dose VIS Date Route   Pfizer COVID-19 Vaccine 03/18/2019  9:47 AM 0.3 mL 01/14/2019 Intramuscular   Manufacturer: [employer], [employer]   Lot: [id number]   NDC: [address]-5

## 2019-04-13 ENCOUNTER — Ambulatory Visit: Payer: 59 | Attending: Internal Medicine

## 2019-04-13 DIAGNOSIS — Z23 Encounter for immunization: Secondary | ICD-10-CM | POA: Insufficient documentation

## 2019-04-13 NOTE — Progress Notes (Signed)
   Covid-19 Vaccination Clinic  Name:  Jane Kim    MRN: [id number] DOB: Dec 07, 1965  04/13/2019  [name] was observed post Covid-19 immunization for 15 minutes without incident. She was provided with Vaccine Information Sheet and instruction to access the V-Safe system.   [name] was instructed to call 911 with any severe reactions post vaccine: Marland Kitchen Difficulty breathing  . Swelling of face and throat  . A fast heartbeat  . A bad rash all over body  . Dizziness and weakness   Immunizations Administered    Name Date Dose VIS Date Route   Pfizer COVID-19 Vaccine 04/13/2019  8:28 AM 0.3 mL 01/14/2019 Intramuscular   Manufacturer: [employer], [employer]   Lot: [id number]   NDC: [address]-1

## 2019-09-21 ENCOUNTER — Telehealth: Payer: Self-pay | Admitting: Oncology

## 2019-09-21 NOTE — Telephone Encounter (Signed)
Please ask his PCP if he wants the pt to see endocrinology first. Elevated PTH, ?primary hyperparathyroidism

## 2019-09-21 NOTE — Telephone Encounter (Signed)
Patient phoned on this date stating that her primary doctor did some labs and that her parathyroid was elevated and recommended seeing the oncologist. Patient was [facility] at the [facility] in 05/2017. Message sent to MD team for recommendations on scheduling.

## 2019-09-21 NOTE — Telephone Encounter (Signed)
Please advise 

## 2019-09-22 NOTE — Telephone Encounter (Signed)
Contacted [facility] and spoke to [place], [place]. She will message Dr. Greggory Stallion and ask ab this and give [place] a call back to see wether pt will be referred to endocrinology or will need to see Dr. Cathie Hoops.

## 2019-09-26 NOTE — Telephone Encounter (Signed)
called [facility] again to ask about the status of this message and I was told that Dr. Greggory Stallion was out of office a couple days last week, but that message was resent and they will let us know what Dr. Greggory Stallion would like.

## 2019-09-30 NOTE — Telephone Encounter (Signed)
Spoke with Jamila, CMA at [facility] and she states that Dr. George has referred patient to Endocrinology.  

## 2019-11-23 ENCOUNTER — Encounter: Payer: Self-pay | Admitting: *Deleted

## 2020-03-28 ENCOUNTER — Other Ambulatory Visit: Payer: Self-pay | Admitting: Family Medicine

## 2020-03-28 DIAGNOSIS — Z1231 Encounter for screening mammogram for malignant neoplasm of breast: Secondary | ICD-10-CM

## 2020-04-19 ENCOUNTER — Ambulatory Visit
Admission: RE | Admit: 2020-04-19 | Discharge: 2020-04-19 | Disposition: A | Discharge status: Home / Self Care | Payer: No Typology Code available for payment source | Source: Ambulatory Visit | Attending: Family Medicine | Admitting: Family Medicine

## 2020-04-19 ENCOUNTER — Other Ambulatory Visit: Payer: Self-pay

## 2020-04-19 DIAGNOSIS — Z1231 Encounter for screening mammogram for malignant neoplasm of breast: Secondary | ICD-10-CM | POA: Diagnosis not present

## 2020-04-24 ENCOUNTER — Other Ambulatory Visit: Payer: Self-pay | Admitting: Orthopedic Surgery

## 2020-04-25 ENCOUNTER — Other Ambulatory Visit: Payer: Self-pay | Admitting: Orthopedic Surgery

## 2020-04-25 DIAGNOSIS — M5416 Radiculopathy, lumbar region: Secondary | ICD-10-CM

## 2020-05-12 ENCOUNTER — Other Ambulatory Visit: Payer: Self-pay

## 2020-05-12 ENCOUNTER — Ambulatory Visit
Admission: RE | Admit: 2020-05-12 | Discharge: 2020-05-12 | Disposition: A | Discharge status: Home / Self Care | Payer: No Typology Code available for payment source | Source: Ambulatory Visit | Attending: Orthopedic Surgery | Admitting: Orthopedic Surgery

## 2020-05-12 DIAGNOSIS — M5416 Radiculopathy, lumbar region: Secondary | ICD-10-CM

## 2021-06-07 ENCOUNTER — Other Ambulatory Visit: Payer: Self-pay | Admitting: Family Medicine

## 2021-06-07 DIAGNOSIS — Z1231 Encounter for screening mammogram for malignant neoplasm of breast: Secondary | ICD-10-CM

## 2021-07-02 ENCOUNTER — Encounter: Payer: Self-pay | Admitting: Oncology

## 2021-07-09 ENCOUNTER — Ambulatory Visit
Admission: RE | Admit: 2021-07-09 | Discharge: 2021-07-09 | Disposition: A | Discharge status: Home / Self Care | Payer: BC Managed Care – PPO | Source: Ambulatory Visit | Attending: Family Medicine | Admitting: Family Medicine

## 2021-07-09 ENCOUNTER — Encounter: Payer: Self-pay | Admitting: Oncology

## 2021-07-09 DIAGNOSIS — Z1231 Encounter for screening mammogram for malignant neoplasm of breast: Secondary | ICD-10-CM | POA: Diagnosis not present

## 2022-02-04 ENCOUNTER — Other Ambulatory Visit: Payer: Self-pay | Admitting: Physician Assistant

## 2022-02-04 DIAGNOSIS — M7989 Other specified soft tissue disorders: Secondary | ICD-10-CM

## 2022-02-05 ENCOUNTER — Ambulatory Visit
Admission: RE | Admit: 2022-02-05 | Discharge: 2022-02-05 | Disposition: A | Discharge status: Home / Self Care | Payer: Managed Care, Other (non HMO) | Source: Ambulatory Visit | Attending: Physician Assistant | Admitting: Physician Assistant

## 2022-02-05 DIAGNOSIS — M79604 Pain in right leg: Secondary | ICD-10-CM | POA: Diagnosis present

## 2022-02-05 DIAGNOSIS — M7989 Other specified soft tissue disorders: Secondary | ICD-10-CM | POA: Insufficient documentation

## 2022-04-17 ENCOUNTER — Encounter: Payer: Self-pay | Admitting: Oncology

## 2022-07-08 ENCOUNTER — Encounter: Payer: Self-pay | Admitting: Oncology

## 2022-12-01 DIAGNOSIS — R739 Hyperglycemia, unspecified: Secondary | ICD-10-CM | POA: Diagnosis not present

## 2022-12-01 DIAGNOSIS — E213 Hyperparathyroidism, unspecified: Secondary | ICD-10-CM | POA: Diagnosis not present

## 2022-12-01 DIAGNOSIS — E559 Vitamin D deficiency, unspecified: Secondary | ICD-10-CM | POA: Diagnosis not present

## 2022-12-01 DIAGNOSIS — Z9884 Bariatric surgery status: Secondary | ICD-10-CM | POA: Diagnosis not present

## 2022-12-03 DIAGNOSIS — N1832 Chronic kidney disease, stage 3b: Secondary | ICD-10-CM | POA: Diagnosis not present

## 2022-12-03 DIAGNOSIS — E7800 Pure hypercholesterolemia, unspecified: Secondary | ICD-10-CM | POA: Diagnosis not present

## 2022-12-03 DIAGNOSIS — F4329 Adjustment disorder with other symptoms: Secondary | ICD-10-CM | POA: Diagnosis not present

## 2022-12-03 DIAGNOSIS — I10 Essential (primary) hypertension: Secondary | ICD-10-CM | POA: Diagnosis not present

## 2022-12-03 DIAGNOSIS — Z23 Encounter for immunization: Secondary | ICD-10-CM | POA: Diagnosis not present

## 2023-01-27 DIAGNOSIS — J209 Acute bronchitis, unspecified: Secondary | ICD-10-CM | POA: Diagnosis not present

## 2023-02-24 DIAGNOSIS — R053 Chronic cough: Secondary | ICD-10-CM | POA: Diagnosis not present

## 2023-02-24 DIAGNOSIS — R509 Fever, unspecified: Secondary | ICD-10-CM | POA: Diagnosis not present

## 2023-04-08 DIAGNOSIS — H25013 Cortical age-related cataract, bilateral: Secondary | ICD-10-CM | POA: Diagnosis not present

## 2023-04-08 DIAGNOSIS — H524 Presbyopia: Secondary | ICD-10-CM | POA: Diagnosis not present

## 2023-04-08 DIAGNOSIS — H2513 Age-related nuclear cataract, bilateral: Secondary | ICD-10-CM | POA: Diagnosis not present

## 2023-04-08 DIAGNOSIS — H35361 Drusen (degenerative) of macula, right eye: Secondary | ICD-10-CM | POA: Diagnosis not present

## 2023-04-13 IMAGING — MG MM DIGITAL SCREENING BILAT W/ TOMO AND CAD
8 of 13 series · 8 of 37 positions shown · non-contrast
Comparison: Previous exam(s).

CLINICAL DATA: Screening.

EXAM:
DIGITAL SCREENING BILATERAL MAMMOGRAM WITH TOMOSYNTHESIS AND CAD
TECHNIQUE: Bilateral screening digital craniocaudal and mediolateral oblique
mammograms were obtained. Bilateral screening digital breast
tomosynthesis was performed. The images were evaluated with
computer-aided detection.

[L CC synth-2D]
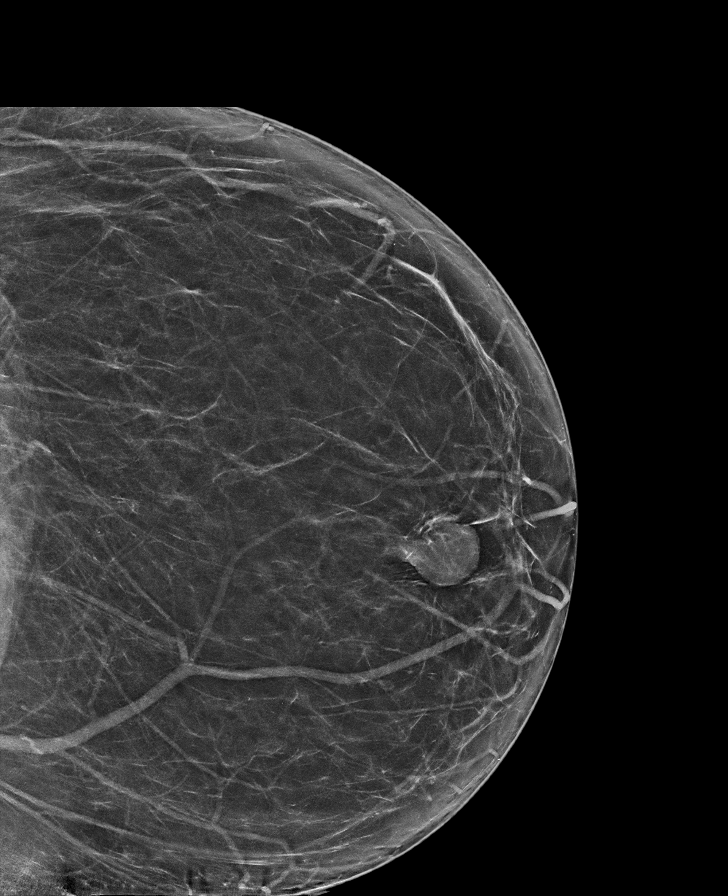

[L XCCM synth-2D]
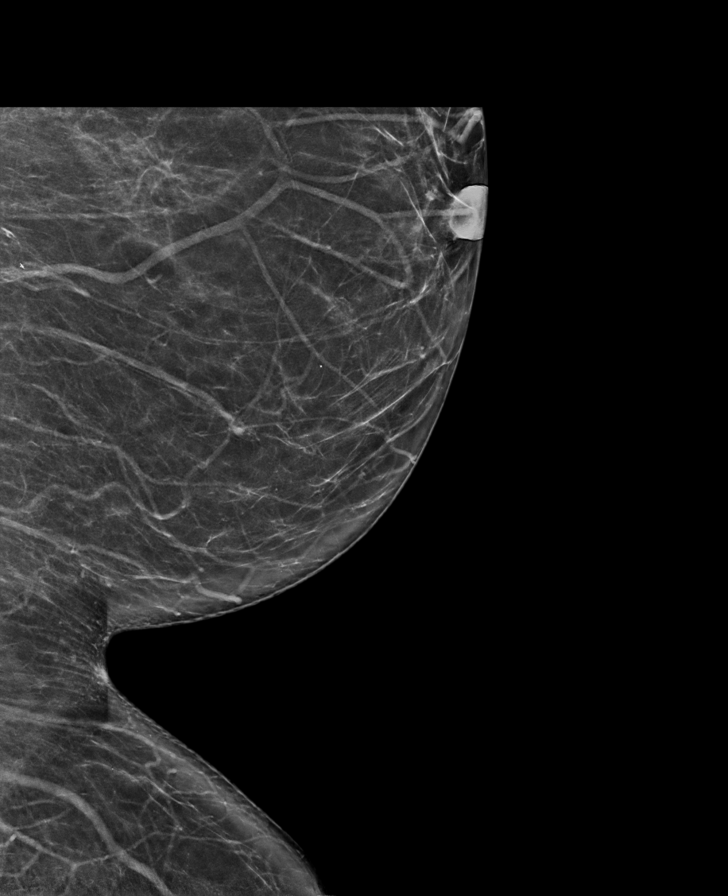

[R CC synth-2D]
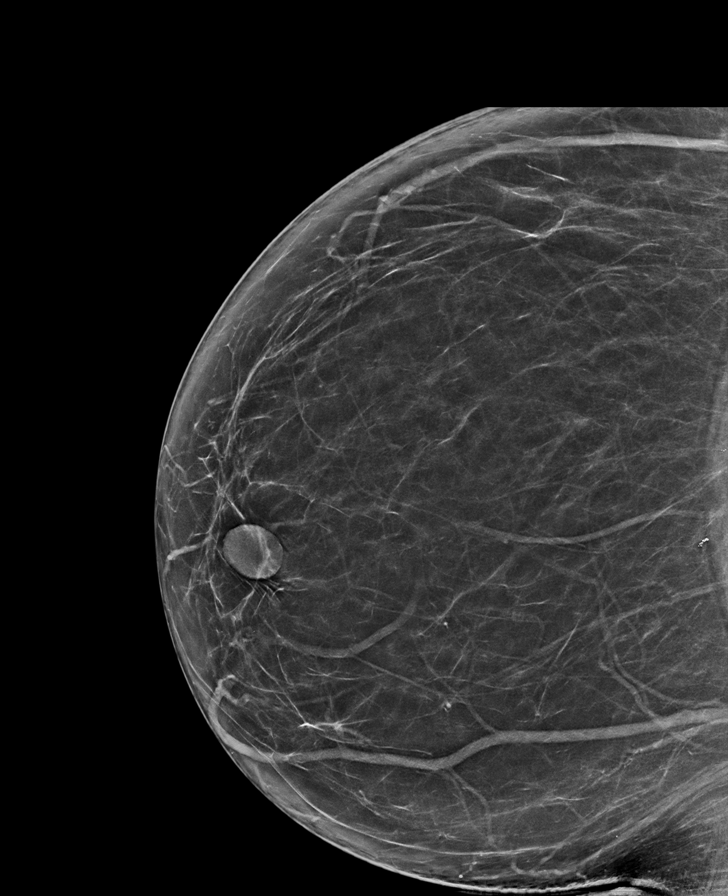

[L MLO synth-2D (1 of 2)]
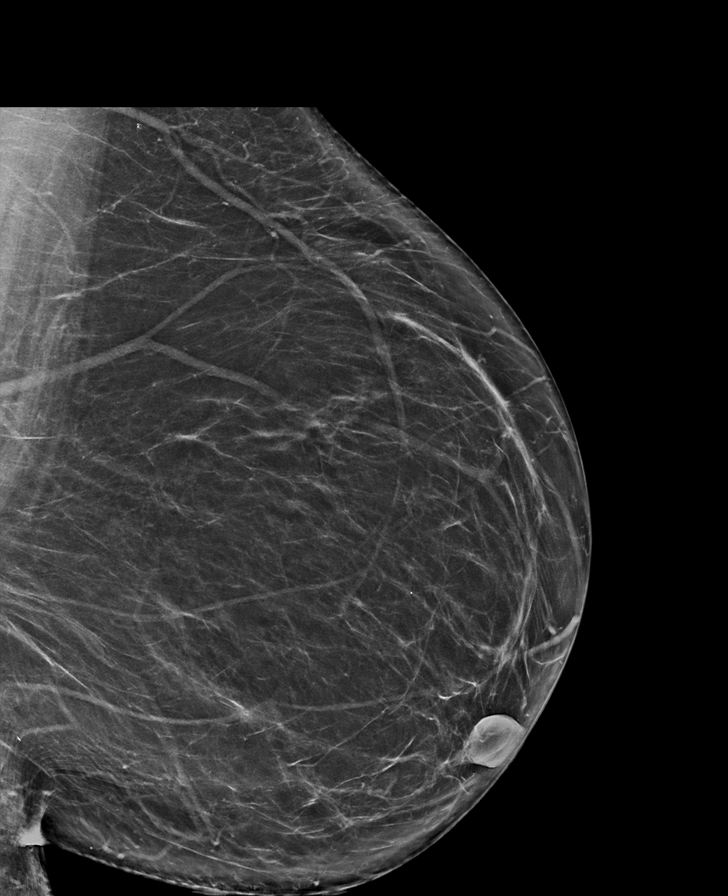

[L MLO synth-2D (2 of 2)]
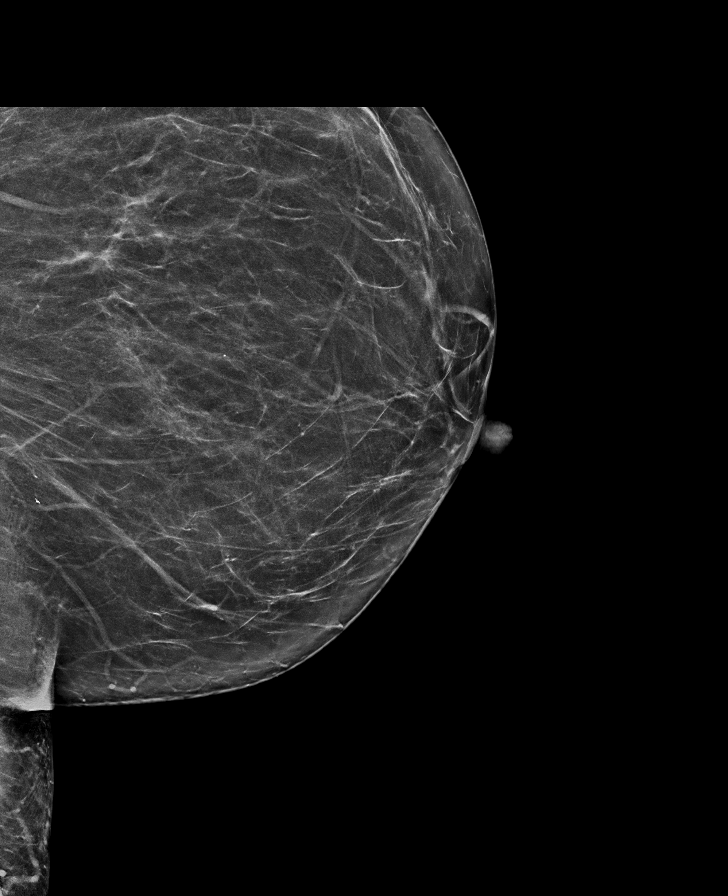

[R MLO synth-2D (1 of 2)]
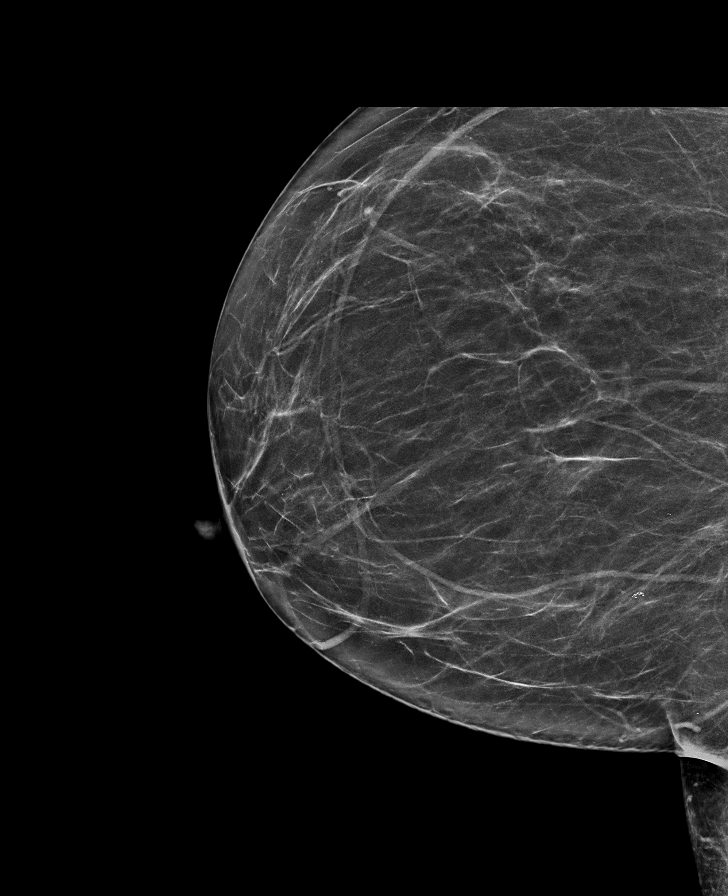

[R MLO synth-2D (2 of 2)]
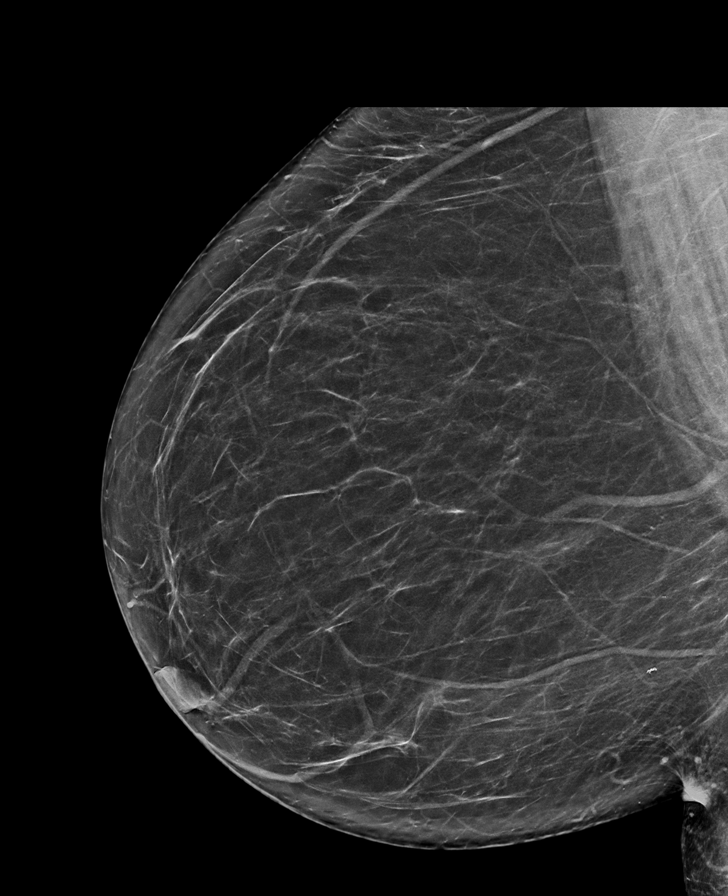

[L MLO tomo · tomo slice 42/83.0]
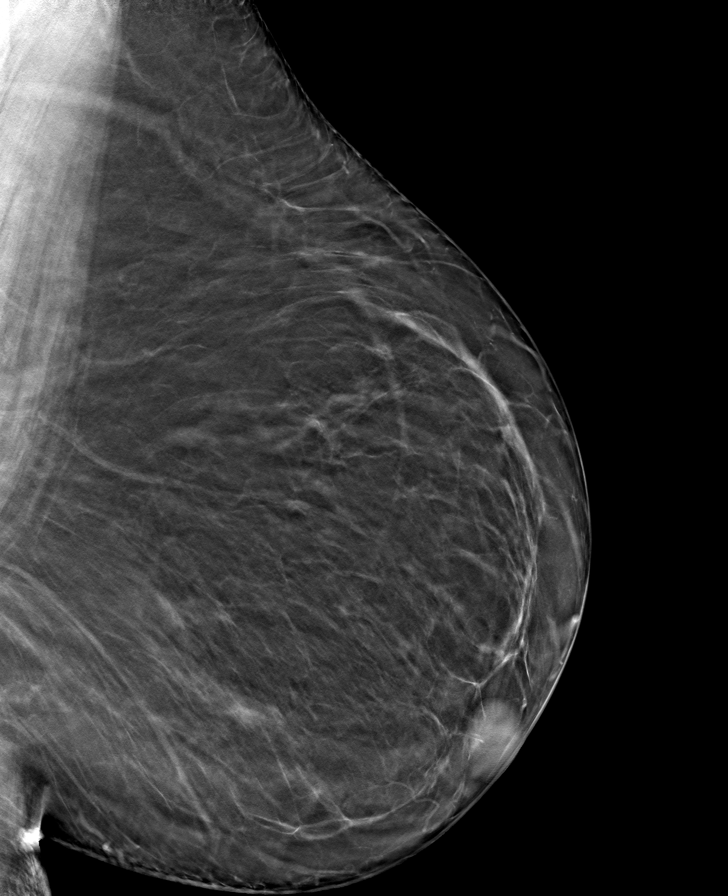

[8 of 37 positions shown; findings below may reference images not displayed]

ACR Breast Density Category b: There are scattered areas of
fibroglandular density.
FINDINGS: There are no findings suspicious for malignancy.
IMPRESSION: No mammographic evidence of malignancy. A result letter of this
screening mammogram will be mailed directly to the patient.

RECOMMENDATION:
Screening mammogram in one year. (Code:51-O-LD2)

BI-RADS CATEGORY  1: Negative.

## 2023-04-22 DIAGNOSIS — H25813 Combined forms of age-related cataract, bilateral: Secondary | ICD-10-CM | POA: Diagnosis not present

## 2023-05-29 DIAGNOSIS — N1832 Chronic kidney disease, stage 3b: Secondary | ICD-10-CM | POA: Diagnosis not present

## 2023-05-29 DIAGNOSIS — R739 Hyperglycemia, unspecified: Secondary | ICD-10-CM | POA: Diagnosis not present

## 2023-05-29 DIAGNOSIS — E213 Hyperparathyroidism, unspecified: Secondary | ICD-10-CM | POA: Diagnosis not present

## 2023-05-29 DIAGNOSIS — E538 Deficiency of other specified B group vitamins: Secondary | ICD-10-CM | POA: Diagnosis not present

## 2023-05-29 DIAGNOSIS — E559 Vitamin D deficiency, unspecified: Secondary | ICD-10-CM | POA: Diagnosis not present

## 2023-07-03 DIAGNOSIS — Z1330 Encounter for screening examination for mental health and behavioral disorders, unspecified: Secondary | ICD-10-CM | POA: Diagnosis not present

## 2023-07-03 DIAGNOSIS — N1832 Chronic kidney disease, stage 3b: Secondary | ICD-10-CM | POA: Diagnosis not present

## 2023-07-03 DIAGNOSIS — G4733 Obstructive sleep apnea (adult) (pediatric): Secondary | ICD-10-CM | POA: Diagnosis not present

## 2023-07-03 DIAGNOSIS — Z1231 Encounter for screening mammogram for malignant neoplasm of breast: Secondary | ICD-10-CM | POA: Diagnosis not present

## 2023-07-03 DIAGNOSIS — Z23 Encounter for immunization: Secondary | ICD-10-CM | POA: Diagnosis not present

## 2023-07-03 DIAGNOSIS — Z1331 Encounter for screening for depression: Secondary | ICD-10-CM | POA: Diagnosis not present

## 2023-07-03 DIAGNOSIS — Z0000 Encounter for general adult medical examination without abnormal findings: Secondary | ICD-10-CM | POA: Diagnosis not present

## 2023-07-03 DIAGNOSIS — E871 Hypo-osmolality and hyponatremia: Secondary | ICD-10-CM | POA: Diagnosis not present

## 2023-07-03 DIAGNOSIS — E538 Deficiency of other specified B group vitamins: Secondary | ICD-10-CM | POA: Diagnosis not present

## 2023-07-13 DIAGNOSIS — R739 Hyperglycemia, unspecified: Secondary | ICD-10-CM | POA: Diagnosis not present

## 2023-07-13 DIAGNOSIS — E213 Hyperparathyroidism, unspecified: Secondary | ICD-10-CM | POA: Diagnosis not present

## 2023-07-22 ENCOUNTER — Other Ambulatory Visit: Payer: Self-pay | Admitting: Family Medicine

## 2023-07-22 DIAGNOSIS — Z1231 Encounter for screening mammogram for malignant neoplasm of breast: Secondary | ICD-10-CM

## 2023-07-23 DIAGNOSIS — R8290 Unspecified abnormal findings in urine: Secondary | ICD-10-CM | POA: Diagnosis not present

## 2023-07-23 DIAGNOSIS — N1832 Chronic kidney disease, stage 3b: Secondary | ICD-10-CM | POA: Diagnosis not present

## 2023-07-23 DIAGNOSIS — D631 Anemia in chronic kidney disease: Secondary | ICD-10-CM | POA: Diagnosis not present

## 2023-07-23 DIAGNOSIS — I129 Hypertensive chronic kidney disease with stage 1 through stage 4 chronic kidney disease, or unspecified chronic kidney disease: Secondary | ICD-10-CM | POA: Diagnosis not present

## 2023-07-23 DIAGNOSIS — N2581 Secondary hyperparathyroidism of renal origin: Secondary | ICD-10-CM | POA: Diagnosis not present

## 2023-07-24 ENCOUNTER — Encounter: Payer: Self-pay | Admitting: Oncology

## 2023-07-24 ENCOUNTER — Other Ambulatory Visit: Payer: Self-pay | Admitting: Nephrology

## 2023-07-24 DIAGNOSIS — N1832 Chronic kidney disease, stage 3b: Secondary | ICD-10-CM

## 2023-07-24 DIAGNOSIS — R8290 Unspecified abnormal findings in urine: Secondary | ICD-10-CM

## 2023-07-24 DIAGNOSIS — D631 Anemia in chronic kidney disease: Secondary | ICD-10-CM

## 2023-07-29 ENCOUNTER — Ambulatory Visit
Admission: RE | Admit: 2023-07-29 | Discharge: 2023-07-29 | Disposition: A | Discharge status: Home / Self Care | Source: Ambulatory Visit | Attending: Nephrology | Admitting: Nephrology

## 2023-07-29 DIAGNOSIS — N1832 Chronic kidney disease, stage 3b: Secondary | ICD-10-CM | POA: Diagnosis not present

## 2023-07-29 DIAGNOSIS — N2889 Other specified disorders of kidney and ureter: Secondary | ICD-10-CM | POA: Diagnosis not present

## 2023-07-29 DIAGNOSIS — D631 Anemia in chronic kidney disease: Secondary | ICD-10-CM | POA: Insufficient documentation

## 2023-07-29 DIAGNOSIS — R8290 Unspecified abnormal findings in urine: Secondary | ICD-10-CM | POA: Insufficient documentation

## 2023-08-31 DIAGNOSIS — I129 Hypertensive chronic kidney disease with stage 1 through stage 4 chronic kidney disease, or unspecified chronic kidney disease: Secondary | ICD-10-CM | POA: Diagnosis not present

## 2023-08-31 DIAGNOSIS — D1771 Benign lipomatous neoplasm of kidney: Secondary | ICD-10-CM | POA: Diagnosis not present

## 2023-08-31 DIAGNOSIS — D631 Anemia in chronic kidney disease: Secondary | ICD-10-CM | POA: Diagnosis not present

## 2023-08-31 DIAGNOSIS — N1832 Chronic kidney disease, stage 3b: Secondary | ICD-10-CM | POA: Diagnosis not present

## 2023-08-31 DIAGNOSIS — N2581 Secondary hyperparathyroidism of renal origin: Secondary | ICD-10-CM | POA: Diagnosis not present

## 2023-09-04 ENCOUNTER — Encounter: Payer: Self-pay | Admitting: Oncology

## 2023-09-04 NOTE — Telephone Encounter (Signed)
 Called patient about her most recent lab results. I said Kidney function is stable. GFR of 35. Consistent with chronic kidney disease stage IIIB.  No significant protein in urine. Patient's hemoglobin has dropped to 8.7 and she has low iron levels. I am sending a referral to GI and for hematology. She will need a full work up for anemia. I also told her that I sent in the referrals in this morning and they should hopefully call you to get those appointment scheduled next week. Patient expressed she understood the information given to her.

## 2023-09-16 DIAGNOSIS — N1832 Chronic kidney disease, stage 3b: Secondary | ICD-10-CM | POA: Diagnosis not present

## 2023-09-16 DIAGNOSIS — D509 Iron deficiency anemia, unspecified: Secondary | ICD-10-CM | POA: Diagnosis not present

## 2023-09-16 DIAGNOSIS — D508 Other iron deficiency anemias: Secondary | ICD-10-CM | POA: Diagnosis not present

## 2023-09-16 DIAGNOSIS — R252 Cramp and spasm: Secondary | ICD-10-CM | POA: Diagnosis not present

## 2023-10-08 ENCOUNTER — Ambulatory Visit
Admission: RE | Admit: 2023-10-08 | Discharge: 2023-10-08 | Disposition: A | Discharge status: Home / Self Care | Attending: Gastroenterology | Admitting: Gastroenterology

## 2023-10-08 ENCOUNTER — Encounter: Payer: Self-pay | Admitting: Gastroenterology

## 2023-10-08 ENCOUNTER — Other Ambulatory Visit: Payer: Self-pay

## 2023-10-08 ENCOUNTER — Ambulatory Visit: Payer: Self-pay | Admitting: Certified Registered"

## 2023-10-08 ENCOUNTER — Encounter
Admission: RE | Disposition: A | Discharge status: Home / Self Care | Payer: Self-pay | Source: Home / Self Care | Attending: Gastroenterology

## 2023-10-08 DIAGNOSIS — Z6841 Body Mass Index (BMI) 40.0 and over, adult: Secondary | ICD-10-CM | POA: Insufficient documentation

## 2023-10-08 DIAGNOSIS — Z13811 Encounter for screening for lower gastrointestinal disorder: Secondary | ICD-10-CM | POA: Diagnosis not present

## 2023-10-08 DIAGNOSIS — N289 Disorder of kidney and ureter, unspecified: Secondary | ICD-10-CM | POA: Insufficient documentation

## 2023-10-08 DIAGNOSIS — I10 Essential (primary) hypertension: Secondary | ICD-10-CM | POA: Insufficient documentation

## 2023-10-08 DIAGNOSIS — Z8249 Family history of ischemic heart disease and other diseases of the circulatory system: Secondary | ICD-10-CM | POA: Diagnosis not present

## 2023-10-08 DIAGNOSIS — E66813 Obesity, class 3: Secondary | ICD-10-CM | POA: Diagnosis not present

## 2023-10-08 DIAGNOSIS — Z5986 Financial insecurity: Secondary | ICD-10-CM | POA: Insufficient documentation

## 2023-10-08 DIAGNOSIS — G4730 Sleep apnea, unspecified: Secondary | ICD-10-CM | POA: Insufficient documentation

## 2023-10-08 DIAGNOSIS — D508 Other iron deficiency anemias: Secondary | ICD-10-CM | POA: Diagnosis not present

## 2023-10-08 DIAGNOSIS — D509 Iron deficiency anemia, unspecified: Secondary | ICD-10-CM | POA: Diagnosis not present

## 2023-10-08 DIAGNOSIS — Z13810 Encounter for screening for upper gastrointestinal disorder: Secondary | ICD-10-CM | POA: Diagnosis not present

## 2023-10-08 DIAGNOSIS — Z9884 Bariatric surgery status: Secondary | ICD-10-CM | POA: Insufficient documentation

## 2023-10-08 HISTORY — PX: ESOPHAGOGASTRODUODENOSCOPY: SHX5428

## 2023-10-08 HISTORY — PX: COLONOSCOPY: SHX5424

## 2023-10-08 SURGERY — COLONOSCOPY
Anesthesia: General

## 2023-10-08 MED ORDER — SODIUM CHLORIDE 0.9 % IV SOLN: INTRAVENOUS | Status: DC

## 2023-10-08 MED ORDER — PROPOFOL 10 MG/ML IV BOLUS
INTRAVENOUS | Status: DC | PRN
  Administered 2023-10-08: 120 mg via INTRAVENOUS
  Administered 2023-10-08: 20 mg via INTRAVENOUS
  Administered 2023-10-08: 100 ug/kg/min via INTRAVENOUS
  Administered 2023-10-08 (×5): 10 mg via INTRAVENOUS

## 2023-10-08 MED ORDER — LIDOCAINE HCL (PF) 2 % IJ SOLN
INTRAMUSCULAR | Status: AC
  Filled 2023-10-08: qty 5

## 2023-10-08 MED ORDER — ONDANSETRON HCL 4 MG/2ML IJ SOLN
INTRAMUSCULAR | Status: AC
  Filled 2023-10-08: qty 2

## 2023-10-08 MED ORDER — GLYCOPYRROLATE 0.2 MG/ML IJ SOLN
INTRAMUSCULAR | Status: AC
Start: 2023-10-08 — End: 2023-10-08
  Filled 2023-10-08: qty 1

## 2023-10-08 MED ORDER — GLYCOPYRROLATE 0.2 MG/ML IJ SOLN
INTRAMUSCULAR | Status: DC | PRN
  Administered 2023-10-08 (×2): .1 mg via INTRAVENOUS

## 2023-10-08 MED ORDER — GLYCOPYRROLATE 0.2 MG/ML IJ SOLN
INTRAMUSCULAR | Status: AC
  Filled 2023-10-08: qty 1

## 2023-10-08 MED ORDER — LIDOCAINE HCL (CARDIAC) PF 100 MG/5ML IV SOSY
PREFILLED_SYRINGE | INTRAVENOUS | Status: DC | PRN
  Administered 2023-10-08: 100 mg via INTRAVENOUS

## 2023-10-08 MED ORDER — PROPOFOL 10 MG/ML IV BOLUS
INTRAVENOUS | Status: AC
  Filled 2023-10-08: qty 20

## 2023-10-08 MED ORDER — PROPOFOL 1000 MG/100ML IV EMUL
INTRAVENOUS | Status: AC
  Filled 2023-10-08: qty 100

## 2023-10-08 MED ORDER — MIDAZOLAM HCL 2 MG/2ML IJ SOLN
INTRAMUSCULAR | Status: AC
  Filled 2023-10-08: qty 2

## 2023-10-08 NOTE — Anesthesia Postprocedure Evaluation (Signed)
 Anesthesia Post Note  Patient: Jane Kim  Procedure(s) Performed: COLONOSCOPY EGD (ESOPHAGOGASTRODUODENOSCOPY)  Patient location during evaluation: Endoscopy Anesthesia Type: General Level of consciousness: awake and alert Pain management: pain level controlled Vital Signs Assessment: post-procedure vital signs reviewed and stable Respiratory status: spontaneous breathing, nonlabored ventilation and respiratory function stable Cardiovascular status: blood pressure returned to baseline and stable Postop Assessment: no apparent nausea or vomiting Anesthetic complications: no   No notable events documented.   Last Vitals:  Vitals:   10/08/23 0927 10/08/23 0937  BP: (!) 146/91 (!) 147/85  Pulse: 87 82  Resp: 11 13  Temp:    SpO2: 100% 100%    Last Pain:  Vitals:   10/08/23 0918  TempSrc: Temporal  PainSc: 0-No pain                 Camellia Merilee Louder

## 2023-10-08 NOTE — H&P (Addendum)
 Ruel Kung , MD [address], [address], [place], [place], [address] Phone: [phone] Fax: [phone]  Primary Care Physician:  Zachary Idelia LABOR, MD   Pre-Procedure History & Physical: HPI:  Jane Kim is a 58 y.o. female is here for an endoscopy and colonoscopy    Past Medical History:  Diagnosis Date   Hypertension    Left kidney mass    Seasonal allergies     Past Surgical History:  Procedure Laterality Date   ABDOMINAL HYSTERECTOMY     CORNEAL TRANSPLANT     DORSAL COMPARTMENT RELEASE Left 02/28/2016   Procedure: RELEASE DORSAL COMPARTMENT (DEQUERVAIN);  Surgeon: Ozell Flake, MD;  Location: [facility] ORS;  Service: Orthopedics;  Laterality: Left;   FOOT ARTHRODESIS, MODIFIED MCBRIDE     GASTRIC BYPASS     TONSILLECTOMY      Prior to Admission medications   Medication Sig Start Date End Date Taking? Authorizing Provider  acetaminophen  (TYLENOL ) 325 MG tablet Take 650 mg by mouth every 6 (six) hours as needed.    [provider]  albuterol (PROVENTIL HFA;VENTOLIN HFA) 108 (90 Base) MCG/ACT inhaler Inhale 1-2 puffs into the lungs every 6 (six) hours as needed for wheezing or shortness of breath.    [provider]  fluticasone (FLONASE) 50 MCG/ACT nasal spray Place into the nose. 10/13/16 10/13/17  [provider]  HYDROcodone -acetaminophen  (NORCO) 5-325 MG tablet Take 1 tablet by mouth every 6 (six) hours as needed. Patient not taking: Reported on 05/19/2017 02/28/16   Flake Ozell, MD  lisinopril (PRINIVIL,ZESTRIL) 10 MG tablet Take by mouth. 10/13/16 07/10/17  [provider]  MELATONIN PO Take 6 mg by mouth at bedtime as needed.    [provider]  Multiple Vitamins-Minerals (MULTIVITAMIN ADULT PO) Take by mouth.    [provider]  Naproxen Sod-Diphenhydramine (ALEVE PM) 220-25 MG TABS Take 2 tablets by mouth at bedtime as needed (for pain/sleep.).    [provider]    Allergies as of 09/17/2023 -  Review Complete 05/19/2017  Allergen Reaction Noted   Shellfish-derived products Anaphylaxis 06/15/2012   Zolpidem Other (See Comments)    Latex Itching and Rash 05/12/2012    Family History  Problem Relation Age of Onset   Breast cancer Maternal Grandmother    Lung cancer Mother    Diabetes Mother    Congestive Heart Failure Mother    Hypertension Mother    Anemia Mother    Kidney disease Mother    Hypertension Father    Coronary artery disease Father    Anemia Sister    Hypertension Sister    Hypertension Brother    Diabetes Maternal Aunt    Prostate cancer Maternal Grandfather    Cirrhosis Paternal Grandfather    Hypertension Brother     Social History   Socioeconomic History   Marital status: Married    Spouse name: Not on file   Number of children: Not on file   Years of education: Not on file   Highest education level: Not on file  Occupational History   Not on file  Tobacco Use   Smoking status: Never   Smokeless tobacco: Never  Vaping Use   Vaping status: Never Used  Substance and Sexual Activity   Alcohol use: No   Drug use: No   Sexual activity: Not Currently  Other Topics Concern   Not on file  Social History Narrative   Not on file   Social Drivers of Health  Financial Resource Strain: Medium Risk (09/16/2023)   Received from [facility]   Overall Financial Resource Strain (CARDIA)    Difficulty of Paying Living Expenses: Somewhat hard  Food Insecurity: No Food Insecurity (09/16/2023)   Received from [facility]   Hunger Vital Sign    Within the past 12 months, you worried that your food would run out before you got the money to buy more.: Never true    Within the past 12 months, the food you bought just didn't last and you didn't have money to get more.: Never true  Transportation Needs: No Transportation Needs (09/16/2023)   Received from [facility] - Transportation    In the  past 12 months, has lack of transportation kept you from medical appointments or from getting medications?: No    Lack of Transportation (Non-Medical): No  Physical Activity: Not on file  Stress: Not on file  Social Connections: Not on file  Intimate Partner Violence: Not on file    Review of Systems: See HPI, otherwise negative ROS  Physical Exam: There were no vitals taken for this visit. General:   Alert,  pleasant and cooperative in NAD Head:  Normocephalic and atraumatic. Neck:  Supple; no masses or thyromegaly. Lungs:  Clear throughout to auscultation, normal respiratory effort.    Heart:  +S1, +S2, Regular rate and rhythm, No edema. Abdomen:  Soft, nontender and nondistended. Normal bowel sounds, without guarding, and without rebound.   Neurologic:  Alert and  oriented x4;  grossly normal neurologically.  Impression/Plan: Jane Kim is here for an endoscopy and colonoscopy  to be performed for  evaluation of iron deficiency anemia    Risks, benefits, limitations, and alternatives regarding endoscopy have been reviewed with the patient.  Questions have been answered.  All parties agreeable.   Ruel Kung, MD  10/08/2023, 8:19 AM

## 2023-10-08 NOTE — Anesthesia Preprocedure Evaluation (Addendum)
 Anesthesia Evaluation  Patient identified by MRN, date of birth, ID band Patient awake    Reviewed: Allergy & Precautions, H&P , NPO status , Patient's Chart, lab work & pertinent test results  Airway Mallampati: III  TM Distance: >3 FB Neck ROM: full    Dental no notable dental hx.    Pulmonary sleep apnea    Pulmonary exam normal        Cardiovascular hypertension, Normal cardiovascular exam     Neuro/Psych negative neurological ROS  negative psych ROS   GI/Hepatic Neg liver ROS,,,H/O gastric bypass   Endo/Other    Class 3 obesity  Renal/GU Renal InsufficiencyRenal disease  negative genitourinary   Musculoskeletal   Abdominal  (+) + obese  Peds  Hematology  (+) Blood dyscrasia, anemia   Anesthesia Other Findings Past Medical History: No date: Hypertension No date: Left kidney mass No date: Seasonal allergies  Past Surgical History: No date: ABDOMINAL HYSTERECTOMY No date: CORNEAL TRANSPLANT 02/28/2016: DORSAL COMPARTMENT RELEASE; Left     Comment:  Procedure: RELEASE DORSAL COMPARTMENT (DEQUERVAIN);                Surgeon: Ozell Flake, MD;  Location: [facility] ORS;  Service:              Orthopedics;  Laterality: Left; No date: FOOT ARTHRODESIS, MODIFIED MCBRIDE No date: GASTRIC BYPASS No date: TONSILLECTOMY     Reproductive/Obstetrics negative OB ROS                              Anesthesia Physical Anesthesia Plan  ASA: 3  Anesthesia Plan: General   Post-op Pain Management:    Induction: Intravenous  PONV Risk Score and Plan: Propofol  infusion and TIVA  Airway Management Planned:   Additional Equipment:   Intra-op Plan:   Post-operative Plan:   Informed Consent: I have reviewed the patients History and Physical, chart, labs and discussed the procedure including the risks, benefits and alternatives for the proposed anesthesia with the patient or authorized  representative who has indicated his/her understanding and acceptance.     Dental Advisory Given  Plan Discussed with: CRNA and Surgeon  Anesthesia Plan Comments:          Anesthesia Quick Evaluation

## 2023-10-08 NOTE — Op Note (Signed)
 [facility]astroenterology Patient Name: Jane Kim Procedure Date: 10/08/2023 8:42 AM MRN: [id number] Account #: [id number] Date of Birth: 1965-08-05 Admit Type: Outpatient Age: 58 Room: [facility] ENDO ROOM 2 Gender: Female Note Status: Finalized Instrument Name: Colon Scope [id number] Procedure:             Colonoscopy Indications:           Iron deficiency anemia Providers:             Ruel Kung MD, MD Referring MD:          Sionne A. Zachary MD, MD (Referring MD) Medicines:             Monitored Anesthesia Care Complications:         No immediate complications. Procedure:             Pre-Anesthesia Assessment:                        - Prior to the procedure, a History and Physical was                         performed, and patient medications, allergies and                         sensitivities were reviewed. The patient's tolerance                         of previous anesthesia was reviewed.                        - The risks and benefits of the procedure and the                         sedation options and risks were discussed with the                         patient. All questions were answered and informed                         consent was obtained.                        - ASA Grade Assessment: II - A patient with mild                         systemic disease.                        After obtaining informed consent, the colonoscope was                         passed under direct vision. Throughout the procedure,                         the patient's blood pressure, pulse, and oxygen                         saturations were monitored continuously. The                         Colonoscope was  introduced through the anus and                         advanced to the the cecum, identified by the                         appendiceal orifice. The colonoscopy was performed                         with ease. The patient tolerated the procedure well.                          The quality of the bowel preparation was excellent.                         The ileocecal valve, appendiceal orifice, and rectum                         were photographed. Findings:      The entire examined colon appeared normal on direct and retroflexion       views.      The perianal and digital rectal examinations were normal. Impression:            - The entire examined colon is normal on direct and                         retroflexion views.                        - No specimens collected. Recommendation:        - Discharge patient to home (with escort).                        - Resume previous diet.                        - Continue present medications.                        - Repeat colonoscopy in 10 years for screening                         purposes. Procedure Code(s):     --- Professional ---                        [phone], Colonoscopy, flexible; diagnostic, including                         collection of specimen(s) by brushing or washing, when                         performed (separate procedure) Diagnosis Code(s):     --- Professional ---                        D50.9, Iron deficiency anemia, unspecified CPT copyright 2022 American Medical Association. All rights reserved. The codes documented in this report are preliminary and upon coder review may  be revised to meet current compliance requirements. Ruel Kung, MD Ruel Kung MD, MD 10/08/2023 9:15:23 AM This report  has been signed electronically. Number of Addenda: 0 Note Initiated On: 10/08/2023 8:42 AM Scope Withdrawal Time: 0 hours 6 minutes 10 seconds  Total Procedure Duration: 0 hours 9 minutes 29 seconds  Estimated Blood Loss:  Estimated blood loss: none. Estimated blood loss: none.      [facility]

## 2023-10-08 NOTE — Transfer of Care (Signed)
 Immediate Anesthesia Transfer of Care Note  Patient: Jane Kim  Procedure(s) Performed: COLONOSCOPY EGD (ESOPHAGOGASTRODUODENOSCOPY)  Patient Location: Endoscopy Unit  Anesthesia Type:General  Level of Consciousness: awake, alert , and oriented  Airway & Oxygen Therapy: Patient Spontanous Breathing  Post-op Assessment: Report given to RN and Post -op Vital signs reviewed and stable  Post vital signs: Reviewed and stable  Last Vitals:  Vitals Value Taken Time  BP 137/76 10/08/23 09:18  Temp 35.9 0918  Pulse 91 10/08/23 09:19  Resp 18 10/08/23 09:19  SpO2 100 % 10/08/23 09:19  Vitals shown include unfiled device data.  Last Pain:  Vitals:   10/08/23 0838  TempSrc: Temporal  PainSc: 0-No pain         Complications: No notable events documented.

## 2023-10-08 NOTE — Op Note (Signed)
 [facility] Patient Name: Jane Kim Procedure Date: 10/08/2023 8:43 AM MRN: [id number] Account #: [id number] Date of Birth: [birth year] Admit Type: Outpatient Age: 58 Room: [facility] ENDO ROOM 2 Gender: Female Note Status: Finalized Instrument Name: Barnie GI Scope [id number] Procedure:             Upper GI endoscopy Indications:           Iron deficiency anemia Providers:             Ruel Kung MD, MD Referring MD:          Sionne A. Zachary MD, MD (Referring MD) Medicines:             Monitored Anesthesia Care Complications:         No immediate complications. Procedure:             Pre-Anesthesia Assessment:                        - Prior to the procedure, a History and Physical was                         performed, and patient medications, allergies and                         sensitivities were reviewed. The patient's tolerance                         of previous anesthesia was reviewed.                        - The risks and benefits of the procedure and the                         sedation options and risks were discussed with the                         patient. All questions were answered and informed                         consent was obtained.                        - ASA Grade Assessment: II - A patient with mild                         systemic disease.                        After obtaining informed consent, the endoscope was                         passed under direct vision. Throughout the procedure,                         the patient's blood pressure, pulse, and oxygen                         saturations were monitored continuously. The Endoscope  was introduced through the mouth, and advanced to the                         jejunum. The upper GI endoscopy was accomplished with                         ease. The patient tolerated the procedure well. Findings:      The esophagus was normal.      The examined  jejunum was normal.      Evidence of a gastric bypass was found. A gastric pouch with a small       size was found. The gastrojejunal anastomosis was characterized by       healthy appearing mucosa. The jejunojejunal anastomosis was       characterized by healthy appearing mucosa. The excluded stomach was not       examined as it could not be found.      The cardia and gastric fundus were normal on retroflexion. Impression:            - Normal esophagus.                        - Normal examined jejunum.                        - Gastric bypass with a small-sized pouch.                         Gastrojejunal anastomosis characterized by healthy                         appearing mucosa.                        - No specimens collected. Recommendation:        - Perform a colonoscopy today. Procedure Code(s):     --- Professional ---                        [phone], Esophagogastroduodenoscopy, flexible,                         transoral; diagnostic, including collection of                         specimen(s) by brushing or washing, when performed                         (separate procedure) Diagnosis Code(s):     --- Professional ---                        S01.15, Bariatric surgery status                        D50.9, Iron deficiency anemia, unspecified CPT copyright 2022 American Medical Association. All rights reserved. The codes documented in this report are preliminary and upon coder review may  be revised to meet current compliance requirements. Ruel Kung, MD Ruel Kung MD, MD 10/08/2023 9:01:21 AM This report has been signed electronically. Number of Addenda: 0 Note Initiated On: 10/08/2023 8:43 AM Estimated Blood Loss:  Estimated blood loss: none.  [facility]

## 2023-11-19 DIAGNOSIS — G4726 Circadian rhythm sleep disorder, shift work type: Secondary | ICD-10-CM | POA: Diagnosis not present

## 2023-11-23 ENCOUNTER — Ambulatory Visit
Admission: RE | Admit: 2023-11-23 | Discharge: 2023-11-23 | Disposition: A | Discharge status: Home / Self Care | Source: Ambulatory Visit | Attending: Family Medicine | Admitting: Family Medicine

## 2023-11-23 DIAGNOSIS — Z1231 Encounter for screening mammogram for malignant neoplasm of breast: Secondary | ICD-10-CM | POA: Diagnosis not present

## 2023-12-02 ENCOUNTER — Other Ambulatory Visit: Payer: Self-pay | Admitting: Nephrology

## 2023-12-02 DIAGNOSIS — D631 Anemia in chronic kidney disease: Secondary | ICD-10-CM

## 2023-12-02 DIAGNOSIS — N1832 Chronic kidney disease, stage 3b: Secondary | ICD-10-CM

## 2023-12-02 DIAGNOSIS — N2889 Other specified disorders of kidney and ureter: Secondary | ICD-10-CM

## 2023-12-02 DIAGNOSIS — N1831 Chronic kidney disease, stage 3a: Secondary | ICD-10-CM

## 2023-12-02 DIAGNOSIS — N2581 Secondary hyperparathyroidism of renal origin: Secondary | ICD-10-CM | POA: Diagnosis not present

## 2023-12-02 DIAGNOSIS — D1771 Benign lipomatous neoplasm of kidney: Secondary | ICD-10-CM | POA: Diagnosis not present

## 2023-12-02 DIAGNOSIS — I129 Hypertensive chronic kidney disease with stage 1 through stage 4 chronic kidney disease, or unspecified chronic kidney disease: Secondary | ICD-10-CM | POA: Diagnosis not present

## 2023-12-04 DIAGNOSIS — R739 Hyperglycemia, unspecified: Secondary | ICD-10-CM | POA: Diagnosis not present

## 2023-12-04 DIAGNOSIS — E559 Vitamin D deficiency, unspecified: Secondary | ICD-10-CM | POA: Diagnosis not present

## 2023-12-04 DIAGNOSIS — E213 Hyperparathyroidism, unspecified: Secondary | ICD-10-CM | POA: Diagnosis not present

## 2023-12-04 DIAGNOSIS — Z9884 Bariatric surgery status: Secondary | ICD-10-CM | POA: Diagnosis not present

## 2023-12-04 NOTE — Patient Instructions (Addendum)
 I'm doing labs today and I will let you know through mychart when I have seen them.   I'm sending in the phentermine again.  Keep watching your diet and working on exercise.    Keep taking your calcium and vitamin D.  I'll let you know if we should change any of those.

## 2023-12-04 NOTE — Telephone Encounter (Signed)
 I called patient about her most recent lab results. After her doctor reviewed them, he said Kidney function better than baseline. GFR of 53. Consistent with chronic kidney disease stage IIIA. PTH remains elevated. Continue vitamin D. Patient remains anemic. Referred to hematology for further work up. No changes are needed at this time. Patient expressed understanding of labs even to her.

## 2023-12-09 ENCOUNTER — Inpatient Hospital Stay

## 2023-12-09 ENCOUNTER — Inpatient Hospital Stay: Attending: Oncology | Admitting: Oncology

## 2023-12-09 ENCOUNTER — Ambulatory Visit: Payer: Self-pay | Admitting: Oncology

## 2023-12-09 ENCOUNTER — Encounter: Payer: Self-pay | Admitting: Oncology

## 2023-12-09 VITALS — BP 146/60 | HR 92 | Temp 97.8°F | Resp 18 | Wt 282.9 lb

## 2023-12-09 DIAGNOSIS — N1831 Chronic kidney disease, stage 3a: Secondary | ICD-10-CM

## 2023-12-09 DIAGNOSIS — Z801 Family history of malignant neoplasm of trachea, bronchus and lung: Secondary | ICD-10-CM | POA: Insufficient documentation

## 2023-12-09 DIAGNOSIS — Z90710 Acquired absence of both cervix and uterus: Secondary | ICD-10-CM | POA: Insufficient documentation

## 2023-12-09 DIAGNOSIS — E538 Deficiency of other specified B group vitamins: Secondary | ICD-10-CM | POA: Insufficient documentation

## 2023-12-09 DIAGNOSIS — D509 Iron deficiency anemia, unspecified: Secondary | ICD-10-CM | POA: Insufficient documentation

## 2023-12-09 DIAGNOSIS — Z803 Family history of malignant neoplasm of breast: Secondary | ICD-10-CM | POA: Diagnosis not present

## 2023-12-09 DIAGNOSIS — D631 Anemia in chronic kidney disease: Secondary | ICD-10-CM | POA: Insufficient documentation

## 2023-12-09 DIAGNOSIS — N2889 Other specified disorders of kidney and ureter: Secondary | ICD-10-CM | POA: Insufficient documentation

## 2023-12-09 LAB — CBC WITH DIFFERENTIAL/PLATELET
Abs Immature Granulocytes: 0.02 K/uL (ref 0.00–0.07)
Basophils Absolute: 0 K/uL (ref 0.0–0.1)
Basophils Relative: 1 %
Eosinophils Absolute: 0.2 K/uL (ref 0.0–0.5)
Eosinophils Relative: 3 %
HCT: 28.4 % — ABNORMAL LOW (ref 36.0–46.0)
Hemoglobin: 8.8 g/dL — ABNORMAL LOW (ref 12.0–15.0)
Immature Granulocytes: 0 %
Lymphocytes Relative: 39 %
Lymphs Abs: 2.6 K/uL (ref 0.7–4.0)
MCH: 28 pg (ref 26.0–34.0)
MCHC: 31 g/dL (ref 30.0–36.0)
MCV: 90.4 fL (ref 80.0–100.0)
Monocytes Absolute: 0.8 K/uL (ref 0.1–1.0)
Monocytes Relative: 12 %
Neutro Abs: 2.9 K/uL (ref 1.7–7.7)
Neutrophils Relative %: 45 %
Platelets: 313 K/uL (ref 150–400)
RBC: 3.14 MIL/uL — ABNORMAL LOW (ref 3.87–5.11)
RDW: 15 % (ref 11.5–15.5)
WBC: 6.6 K/uL (ref 4.0–10.5)
nRBC: 0 % (ref 0.0–0.2)

## 2023-12-09 LAB — RETIC PANEL
Immature Retic Fract: 7.8 % (ref 2.3–15.9)
RBC.: 3.18 MIL/uL — ABNORMAL LOW (ref 3.87–5.11)
Retic Count, Absolute: 48 K/uL (ref 19.0–186.0)
Retic Ct Pct: 1.5 % (ref 0.4–3.1)
Reticulocyte Hemoglobin: 27.9 pg — ABNORMAL LOW (ref >27.9)

## 2023-12-09 LAB — FOLATE: Folate: 19.2 ng/mL (ref >5.9)

## 2023-12-09 LAB — IRON AND TIBC
Iron: 33 ug/dL (ref 28–170)
Saturation Ratios: 8 % — ABNORMAL LOW (ref 10.4–31.8)
TIBC: 409 ug/dL (ref 250–450)
UIBC: 376 ug/dL

## 2023-12-09 LAB — LACTATE DEHYDROGENASE: LDH: 190 U/L (ref 98–192)

## 2023-12-09 LAB — FERRITIN: Ferritin: 14 ng/mL (ref 11–307)

## 2023-12-09 LAB — VITAMIN B12: Vitamin B-12: 192 pg/mL (ref 180–914)

## 2023-12-09 NOTE — Assessment & Plan Note (Addendum)
 Chronic anemia likely secondary to chronic kidney disease, rule out other etiologies.   Check cbc iron tibc ferritin, ldh, B12, folate

## 2023-12-09 NOTE — Progress Notes (Signed)
 Hematology/Oncology Consult note [phone] [phone]        REFERRING PROVIDER: Douglas Rule, MD   CHIEF COMPLAINTS/REASON FOR VISIT:  Evaluation of anemia due to CKD   ASSESSMENT & PLAN:   Anemia in stage 3a chronic kidney disease (HCC) Chronic anemia likely secondary to chronic kidney disease, rule out other etiologies.   Check cbc iron tibc ferritin, ldh, B12, folate     B12 deficiency History of B12 deficiency. Check B12  Iron deficiency anemia Lab Results  Component Value Date   HGB 8.8 (L) 12/09/2023   TIBC 409 12/09/2023   IRONPCTSAT 8 (L) 12/09/2023   FERRITIN 14 12/09/2023     Iron panel is consistent with iron deficieny anemia.  Recommend IV venofer.  I discussed about the potential risks including but not limited to allergic reactions/infusion reactions including anaphylactic reactions, diarrhea, phlebitis, high blood pressure, wheezing, SOB, skin rash, weight gain,dark urine, leg swelling, back pain, headache, nausea and fatigue, etc. Patient tolerates oral iron supplement poorly and desires to achieved higher level of iron faster for adequate hematopoesis. Plan IV venofer weekly x 4   Recommend GI evaluation.    Orders Placed This Encounter  Procedures   Ferritin    Standing Status:   Future    Number of Occurrences:   1    Expected Date:   12/09/2023    Expiration Date:   03/08/2024   Iron and TIBC    Standing Status:   Future    Number of Occurrences:   1    Expected Date:   12/09/2023    Expiration Date:   03/08/2024   Retic Panel    Standing Status:   Future    Number of Occurrences:   1    Expected Date:   12/09/2023    Expiration Date:   03/08/2024   CBC with Differential/Platelet    Standing Status:   Future    Number of Occurrences:   1    Expected Date:   12/09/2023    Expiration Date:   03/08/2024   Lactate dehydrogenase    Standing Status:   Future    Number of Occurrences:   1    Expected Date:   12/09/2023     Expiration Date:   03/08/2024   Vitamin B12    Standing Status:   Future    Number of Occurrences:   1    Expected Date:   12/09/2023    Expiration Date:   03/08/2024   Folate    Standing Status:   Future    Number of Occurrences:   1    Expected Date:   12/09/2023    Expiration Date:   03/08/2024   Follow up in 3 months.   All questions were answered. The patient knows to call the clinic with any problems, questions or concerns.  Zelphia Cap, MD, PhD [facility] Hematology Oncology 12/09/2023   HISTORY OF PRESENTING ILLNESS:   Jane Kim is a  57 y.o.  female with PMH listed below was seen in consultation at the request of  Douglas Rule, MD  for evaluation of anemia.   Discussed the use of AI scribe software for clinical note transcription with the patient, who gave verbal consent to proceed.   She has a history of chronic kidney disease and has been experiencing anemia for an extended period. Iron levels checked in July were within normal limits. She is currently on a generic form of Jardiance  and has a small kidney mass.  There is a family history of chronic kidney disease affecting her grandmother, mother, sister, and niece. She does not have diabetes.  She has a history of low B12 levels, noted six months ago, and is currently on B12 supplements. Despite supplementation, her B12 levels remain low.  No recent blood in the stool. She has a history of hysterectomy and notes experiencing a tiny bit of swelling at baseline.  07/23/2023 SPEP showed no M protein. Kappa light chain 35.3, slightly elevated. Kappa/lamda ratio 1.65, within normal limits,  MEDICAL HISTORY:  Past Medical History:  Diagnosis Date   Hypertension    Left kidney mass    Seasonal allergies     SURGICAL HISTORY: Past Surgical History:  Procedure Laterality Date   ABDOMINAL HYSTERECTOMY     COLONOSCOPY N/A 10/08/2023   Procedure: COLONOSCOPY;  Surgeon: Therisa Bi, MD;  Location: [facility] ENDOSCOPY;   Service: Gastroenterology;  Laterality: N/A;   CORNEAL TRANSPLANT     DORSAL COMPARTMENT RELEASE Left 02/28/2016   Procedure: RELEASE DORSAL COMPARTMENT (DEQUERVAIN);  Surgeon: Ozell Flake, MD;  Location: [facility] ORS;  Service: Orthopedics;  Laterality: Left;   ESOPHAGOGASTRODUODENOSCOPY N/A 10/08/2023   Procedure: EGD (ESOPHAGOGASTRODUODENOSCOPY);  Surgeon: Therisa Bi, MD;  Location: [facility] ENDOSCOPY;  Service: Gastroenterology;  Laterality: N/A;   FOOT ARTHRODESIS, MODIFIED MCBRIDE     GASTRIC BYPASS     TONSILLECTOMY      SOCIAL HISTORY: Social History   Socioeconomic History   Marital status: Married    Spouse name: Not on file   Number of children: Not on file   Years of education: Not on file   Highest education level: Not on file  Occupational History   Not on file  Tobacco Use   Smoking status: Never   Smokeless tobacco: Never  Vaping Use   Vaping status: Never Used  Substance and Sexual Activity   Alcohol use: No   Drug use: No   Sexual activity: Not Currently  Other Topics Concern   Not on file  Social History Narrative   Not on file   Social Drivers of Health   Financial Resource Strain: Medium Risk (09/16/2023)   Received from [facility]   Overall Financial Resource Strain (CARDIA)    Difficulty of Paying Living Expenses: Somewhat hard  Food Insecurity: No Food Insecurity (09/16/2023)   Received from [facility]   Hunger Vital Sign    Within the past 12 months, you worried that your food would run out before you got the money to buy more.: Never true    Within the past 12 months, the food you bought just didn't last and you didn't have money to get more.: Never true  Transportation Needs: No Transportation Needs (09/16/2023)   Received from [facility] - Transportation    In the past 12 months, has lack of transportation kept you from medical appointments or from getting medications?: No    Lack of  Transportation (Non-Medical): No  Physical Activity: Not on file  Stress: Not on file  Social Connections: Not on file  Intimate Partner Violence: Not on file    FAMILY HISTORY: Family History  Problem Relation Age of Onset   Breast cancer Maternal Grandmother    Lung cancer Mother    Diabetes Mother    Congestive Heart Failure Mother    Hypertension Mother    Anemia Mother    Kidney disease Mother  Hypertension Father    Coronary artery disease Father    Anemia Sister    Hypertension Sister    Hypertension Brother    Diabetes Maternal Aunt    Prostate cancer Maternal Grandfather    Cirrhosis Paternal Grandfather    Hypertension Brother     ALLERGIES:  is allergic to shellfish protein-containing drug products, zolpidem, and latex.  MEDICATIONS:  Current Outpatient Medications  Medication Sig Dispense Refill   acetaminophen  (TYLENOL ) 325 MG tablet Take 650 mg by mouth every 6 (six) hours as needed.     albuterol (PROVENTIL HFA;VENTOLIN HFA) 108 (90 Base) MCG/ACT inhaler Inhale 1-2 puffs into the lungs every 6 (six) hours as needed for wheezing or shortness of breath.     aspirin EC 81 MG tablet Take 81 mg by mouth daily. Swallow whole.     cyanocobalamin  (VITAMIN B12) 1000 MCG/ML injection Inject 1,000 mcg into the muscle every 30 (thirty) days.     dapagliflozin propanediol (FARXIGA) 5 MG TABS tablet Take 5 mg by mouth.     fluticasone (FLONASE) 50 MCG/ACT nasal spray Place into the nose.     gabapentin (NEURONTIN) 300 MG capsule Take 300 mg by mouth 3 (three) times daily.     hydrALAZINE (APRESOLINE) 100 MG tablet Take 100 mg by mouth.     lisinopril (PRINIVIL,ZESTRIL) 10 MG tablet Take by mouth.     MELATONIN PO Take 6 mg by mouth at bedtime as needed.     Multiple Vitamins-Minerals (MULTIVITAMIN ADULT PO) Take by mouth.     Vitamin D, Ergocalciferol, (DRISDOL) 1.25 MG (50000 UNIT) CAPS capsule Take 50,000 Units by mouth 2 (two) times a week.      HYDROcodone -acetaminophen  (NORCO) 5-325 MG tablet Take 1 tablet by mouth every 6 (six) hours as needed. (Patient not taking: Reported on 12/09/2023) 15 tablet 0   Naproxen Sod-Diphenhydramine (ALEVE PM) 220-25 MG TABS Take 2 tablets by mouth at bedtime as needed (for pain/sleep.). (Patient not taking: Reported on 12/09/2023)     No current facility-administered medications for this visit.    Review of Systems  Constitutional:  Negative for appetite change, chills, fatigue and fever.  HENT:   Negative for hearing loss and voice change.   Eyes:  Negative for eye problems.  Respiratory:  Negative for chest tightness and cough.   Cardiovascular:  Negative for chest pain.  Gastrointestinal:  Negative for abdominal distention, abdominal pain and blood in stool.  Endocrine: Negative for hot flashes.  Genitourinary:  Negative for difficulty urinating and frequency.   Musculoskeletal:  Negative for arthralgias.  Skin:  Negative for itching and rash.  Neurological:  Negative for extremity weakness.  Hematological:  Negative for adenopathy.  Psychiatric/Behavioral:  Negative for confusion.    PHYSICAL EXAMINATION:  Vitals:   12/09/23 1121 12/09/23 1136  BP: (!) 149/68 (!) 146/60  Pulse: 92   Resp: 18   Temp: 97.8 F (36.6 C)   SpO2: 99%    Filed Weights   12/09/23 1121  Weight: 282 lb 14.4 oz (128.3 kg)    Physical Exam Constitutional:      General: She is not in acute distress.    Appearance: She is obese.  HENT:     Head: Normocephalic and atraumatic.  Eyes:     General: No scleral icterus. Cardiovascular:     Rate and Rhythm: Normal rate and regular rhythm.     Heart sounds: Normal heart sounds.  Pulmonary:     Effort: Pulmonary effort is normal.  No respiratory distress.     Breath sounds: No wheezing.  Abdominal:     General: Bowel sounds are normal. There is no distension.     Palpations: Abdomen is soft.  Musculoskeletal:        General: No deformity. Normal range of  motion.     Cervical back: Normal range of motion and neck supple.  Skin:    General: Skin is warm and dry.     Findings: No erythema or rash.  Neurological:     Mental Status: She is alert and oriented to person, place, and time. Mental status is at baseline.     Cranial Nerves: No cranial nerve deficit.     Coordination: Coordination normal.  Psychiatric:        Mood and Affect: Mood normal.     LABORATORY DATA:  I have reviewed the data as listed    Latest Ref Rng & Units 12/09/2023   12:03 PM 05/19/2017   10:33 AM  CBC  WBC 4.0 - 10.5 K/uL 6.6  5.3   Hemoglobin 12.0 - 15.0 g/dL 8.8  89.8   Hematocrit 36.0 - 46.0 % 28.4  30.1   Platelets 150 - 400 K/uL 313  345       Latest Ref Rng & Units 07/31/2016    9:44 AM  CMP  Creatinine 0.44 - 1.00 mg/dL 8.88       RADIOGRAPHIC STUDIES: I have personally reviewed the radiological images as listed and agreed with the findings in the report. MM 3D SCREENING MAMMOGRAM BILATERAL BREAST Result Date: 11/26/2023 CLINICAL DATA:  Screening. EXAM: DIGITAL SCREENING BILATERAL MAMMOGRAM WITH TOMOSYNTHESIS AND CAD TECHNIQUE: Bilateral screening digital craniocaudal and mediolateral oblique mammograms were obtained. Bilateral screening digital breast tomosynthesis was performed. The images were evaluated with computer-aided detection. COMPARISON:  Previous exam(s). ACR Breast Density Category b: There are scattered areas of fibroglandular density. FINDINGS: There are no findings suspicious for malignancy. IMPRESSION: No mammographic evidence of malignancy. A result letter of this screening mammogram will be mailed directly to the patient. RECOMMENDATION: Screening mammogram in one year. (Code:SM-B-01Y) BI-RADS CATEGORY  1: Negative. Electronically Signed   By: Alm Parkins M.D.   On: 11/26/2023 09:03

## 2023-12-09 NOTE — Assessment & Plan Note (Signed)
 History of B12 deficiency. Check B12

## 2023-12-09 NOTE — Assessment & Plan Note (Addendum)
 Lab Results  Component Value Date   HGB 8.8 (L) 12/09/2023   TIBC 409 12/09/2023   IRONPCTSAT 8 (L) 12/09/2023   FERRITIN 14 12/09/2023     Iron panel is consistent with iron deficieny anemia.  Recommend IV venofer.  I discussed about the potential risks including but not limited to allergic reactions/infusion reactions including anaphylactic reactions, diarrhea, phlebitis, high blood pressure, wheezing, SOB, skin rash, weight gain,dark urine, leg swelling, back pain, headache, nausea and fatigue, etc. Patient tolerates oral iron supplement poorly and desires to achieved higher level of iron faster for adequate hematopoesis. Plan IV venofer weekly x 4   Recommend GI evaluation.

## 2023-12-14 ENCOUNTER — Inpatient Hospital Stay

## 2023-12-14 VITALS — BP 124/62 | HR 89 | Temp 97.2°F | Resp 16

## 2023-12-14 DIAGNOSIS — E538 Deficiency of other specified B group vitamins: Secondary | ICD-10-CM

## 2023-12-14 DIAGNOSIS — D631 Anemia in chronic kidney disease: Secondary | ICD-10-CM

## 2023-12-14 DIAGNOSIS — N1831 Chronic kidney disease, stage 3a: Secondary | ICD-10-CM | POA: Diagnosis not present

## 2023-12-14 MED ORDER — IRON SUCROSE 20 MG/ML IV SOLN
200.0000 mg | Freq: Once | INTRAVENOUS | Status: AC
  Administered 2023-12-14: 200 mg via INTRAVENOUS
  Filled 2023-12-14: qty 10

## 2023-12-14 NOTE — Patient Instructions (Signed)

## 2023-12-16 DIAGNOSIS — Z9884 Bariatric surgery status: Secondary | ICD-10-CM | POA: Diagnosis not present

## 2023-12-16 DIAGNOSIS — D508 Other iron deficiency anemias: Secondary | ICD-10-CM | POA: Diagnosis not present

## 2023-12-18 ENCOUNTER — Ambulatory Visit
Admission: RE | Admit: 2023-12-18 | Discharge: 2023-12-18 | Disposition: A | Discharge status: Home / Self Care | Source: Ambulatory Visit | Attending: Nephrology | Admitting: Nephrology

## 2023-12-18 DIAGNOSIS — N1831 Chronic kidney disease, stage 3a: Secondary | ICD-10-CM | POA: Diagnosis not present

## 2023-12-18 DIAGNOSIS — N2889 Other specified disorders of kidney and ureter: Secondary | ICD-10-CM | POA: Diagnosis not present

## 2023-12-18 DIAGNOSIS — N1832 Chronic kidney disease, stage 3b: Secondary | ICD-10-CM | POA: Insufficient documentation

## 2023-12-18 DIAGNOSIS — D631 Anemia in chronic kidney disease: Secondary | ICD-10-CM | POA: Insufficient documentation

## 2023-12-20 DIAGNOSIS — L02416 Cutaneous abscess of left lower limb: Secondary | ICD-10-CM | POA: Diagnosis not present

## 2023-12-21 ENCOUNTER — Inpatient Hospital Stay

## 2023-12-21 VITALS — BP 115/64 | HR 80 | Temp 96.7°F | Resp 18

## 2023-12-21 DIAGNOSIS — E538 Deficiency of other specified B group vitamins: Secondary | ICD-10-CM

## 2023-12-21 DIAGNOSIS — N1831 Chronic kidney disease, stage 3a: Secondary | ICD-10-CM | POA: Diagnosis not present

## 2023-12-21 DIAGNOSIS — D631 Anemia in chronic kidney disease: Secondary | ICD-10-CM

## 2023-12-21 MED ORDER — SODIUM CHLORIDE 0.9% FLUSH
10.0000 mL | Freq: Once | INTRAVENOUS | Status: AC | PRN
  Administered 2023-12-21: 10 mL
  Filled 2023-12-21: qty 10

## 2023-12-21 MED ORDER — IRON SUCROSE 20 MG/ML IV SOLN
200.0000 mg | Freq: Once | INTRAVENOUS | Status: AC
  Administered 2023-12-21: 200 mg via INTRAVENOUS
  Filled 2023-12-21: qty 10

## 2023-12-28 ENCOUNTER — Inpatient Hospital Stay

## 2023-12-28 VITALS — BP 140/76 | HR 86 | Temp 98.0°F | Resp 18

## 2023-12-28 DIAGNOSIS — N1831 Chronic kidney disease, stage 3a: Secondary | ICD-10-CM | POA: Diagnosis not present

## 2023-12-28 DIAGNOSIS — D631 Anemia in chronic kidney disease: Secondary | ICD-10-CM

## 2023-12-28 DIAGNOSIS — E538 Deficiency of other specified B group vitamins: Secondary | ICD-10-CM

## 2023-12-28 MED ORDER — SODIUM CHLORIDE 0.9% FLUSH
10.0000 mL | Freq: Once | INTRAVENOUS | Status: AC | PRN
  Administered 2023-12-28: 10 mL
  Filled 2023-12-28: qty 10

## 2023-12-28 MED ORDER — IRON SUCROSE 20 MG/ML IV SOLN
200.0000 mg | Freq: Once | INTRAVENOUS | Status: AC
  Administered 2023-12-28: 200 mg via INTRAVENOUS
  Filled 2023-12-28: qty 10

## 2024-01-04 ENCOUNTER — Inpatient Hospital Stay: Attending: Oncology

## 2024-01-04 VITALS — BP 125/56 | HR 77 | Temp 97.7°F | Resp 18

## 2024-01-04 DIAGNOSIS — D631 Anemia in chronic kidney disease: Secondary | ICD-10-CM | POA: Insufficient documentation

## 2024-01-04 DIAGNOSIS — E538 Deficiency of other specified B group vitamins: Secondary | ICD-10-CM | POA: Insufficient documentation

## 2024-01-04 DIAGNOSIS — D509 Iron deficiency anemia, unspecified: Secondary | ICD-10-CM | POA: Diagnosis not present

## 2024-01-04 DIAGNOSIS — N2889 Other specified disorders of kidney and ureter: Secondary | ICD-10-CM | POA: Insufficient documentation

## 2024-01-04 DIAGNOSIS — N1831 Chronic kidney disease, stage 3a: Secondary | ICD-10-CM | POA: Insufficient documentation

## 2024-01-04 MED ORDER — IRON SUCROSE 20 MG/ML IV SOLN
200.0000 mg | Freq: Once | INTRAVENOUS | Status: AC
  Administered 2024-01-04: 200 mg via INTRAVENOUS
  Filled 2024-01-04: qty 10

## 2024-01-04 NOTE — Patient Instructions (Signed)

## 2024-03-08 ENCOUNTER — Inpatient Hospital Stay

## 2024-03-08 ENCOUNTER — Telehealth: Payer: Self-pay | Admitting: Oncology

## 2024-03-08 NOTE — Telephone Encounter (Signed)
 Pt was scheduled for lab this morning, no showed.   I called and spoke with pt and confirmed lab appt for tomorrow afternoon and confirmed next weeks MD/venofer  appt date/time

## 2024-03-09 ENCOUNTER — Inpatient Hospital Stay: Attending: Oncology

## 2024-03-09 DIAGNOSIS — D509 Iron deficiency anemia, unspecified: Secondary | ICD-10-CM

## 2024-03-09 LAB — CBC (CANCER CENTER ONLY)
HCT: 33.6 % — ABNORMAL LOW (ref 36.0–46.0)
Hemoglobin: 10.3 g/dL — ABNORMAL LOW (ref 12.0–15.0)
MCH: 28.3 pg (ref 26.0–34.0)
MCHC: 30.7 g/dL (ref 30.0–36.0)
MCV: 92.3 fL (ref 80.0–100.0)
Platelet Count: 276 10*3/uL (ref 150–400)
RBC: 3.64 MIL/uL — ABNORMAL LOW (ref 3.87–5.11)
RDW: 15 % (ref 11.5–15.5)
WBC Count: 6.7 10*3/uL (ref 4.0–10.5)
nRBC: 0 % (ref 0.0–0.2)

## 2024-03-09 LAB — IRON AND TIBC
Iron: 84 ug/dL (ref 28–170)
Saturation Ratios: 28 % (ref 10.4–31.8)
TIBC: 304 ug/dL (ref 250–450)
UIBC: 220 ug/dL

## 2024-03-09 LAB — FERRITIN: Ferritin: 168 ng/mL (ref 11–307)

## 2024-03-15 ENCOUNTER — Inpatient Hospital Stay

## 2024-03-15 ENCOUNTER — Inpatient Hospital Stay: Admitting: Oncology
